# Patient Record
Sex: Male | Born: 1980 | Race: Black or African American | Hispanic: No | Marital: Single | Smoking: Former smoker
Health system: Southern US, Community
[De-identification: ages and names within clinical notes are randomized; demographics above are authoritative.]

## PROBLEM LIST (undated history)

## (undated) DIAGNOSIS — N200 Calculus of kidney: Secondary | ICD-10-CM

## (undated) DIAGNOSIS — Z87442 Personal history of urinary calculi: Secondary | ICD-10-CM

## (undated) HISTORY — PX: PARATHYROIDECTOMY: SHX19

## (undated) HISTORY — PX: HERNIA REPAIR: SHX51

---

## 2004-06-06 ENCOUNTER — Ambulatory Visit: Payer: Self-pay | Admitting: Urology

## 2004-06-17 ENCOUNTER — Ambulatory Visit: Payer: Self-pay | Admitting: Urology

## 2005-02-01 ENCOUNTER — Emergency Department: Payer: Self-pay | Admitting: Unknown Physician Specialty

## 2005-05-30 ENCOUNTER — Ambulatory Visit: Payer: Self-pay | Admitting: Urology

## 2005-06-16 ENCOUNTER — Emergency Department: Payer: Self-pay | Admitting: General Practice

## 2005-06-18 ENCOUNTER — Ambulatory Visit: Payer: Self-pay | Admitting: Urology

## 2005-06-21 ENCOUNTER — Observation Stay: Payer: Self-pay | Admitting: Urology

## 2005-07-07 ENCOUNTER — Ambulatory Visit: Payer: Self-pay | Admitting: Urology

## 2005-07-29 ENCOUNTER — Emergency Department: Payer: Self-pay | Admitting: Emergency Medicine

## 2005-10-10 ENCOUNTER — Ambulatory Visit: Payer: Self-pay | Admitting: Urology

## 2005-10-26 ENCOUNTER — Emergency Department: Payer: Self-pay | Admitting: Emergency Medicine

## 2006-03-15 ENCOUNTER — Emergency Department: Payer: Self-pay | Admitting: Emergency Medicine

## 2006-04-13 ENCOUNTER — Ambulatory Visit: Payer: Self-pay | Admitting: Urology

## 2006-08-10 ENCOUNTER — Ambulatory Visit: Payer: Self-pay | Admitting: Urology

## 2006-11-22 ENCOUNTER — Emergency Department: Payer: Self-pay | Admitting: Emergency Medicine

## 2006-12-05 ENCOUNTER — Emergency Department: Payer: Self-pay | Admitting: Emergency Medicine

## 2007-02-05 ENCOUNTER — Emergency Department: Payer: Self-pay | Admitting: Emergency Medicine

## 2007-02-12 ENCOUNTER — Emergency Department: Payer: Self-pay | Admitting: Emergency Medicine

## 2007-03-20 ENCOUNTER — Emergency Department: Payer: Self-pay | Admitting: Emergency Medicine

## 2007-04-16 ENCOUNTER — Emergency Department: Payer: Self-pay | Admitting: Emergency Medicine

## 2007-04-24 ENCOUNTER — Emergency Department: Payer: Self-pay | Admitting: Internal Medicine

## 2007-05-12 ENCOUNTER — Emergency Department: Payer: Self-pay | Admitting: Emergency Medicine

## 2007-06-10 ENCOUNTER — Ambulatory Visit: Payer: Self-pay | Admitting: Urology

## 2007-06-17 ENCOUNTER — Emergency Department: Payer: Self-pay | Admitting: Internal Medicine

## 2007-06-24 ENCOUNTER — Ambulatory Visit: Payer: Self-pay | Admitting: Urology

## 2007-07-12 ENCOUNTER — Emergency Department: Payer: Self-pay | Admitting: Internal Medicine

## 2007-07-15 ENCOUNTER — Ambulatory Visit: Payer: Self-pay | Admitting: Urology

## 2007-07-17 IMAGING — CR DG ABDOMEN 1V
1 series · 1 of 1 positions shown · non-contrast
Comparison: none

REASON FOR EXAM: Nephrolithiasis
COMMENTS:

[view not recorded]
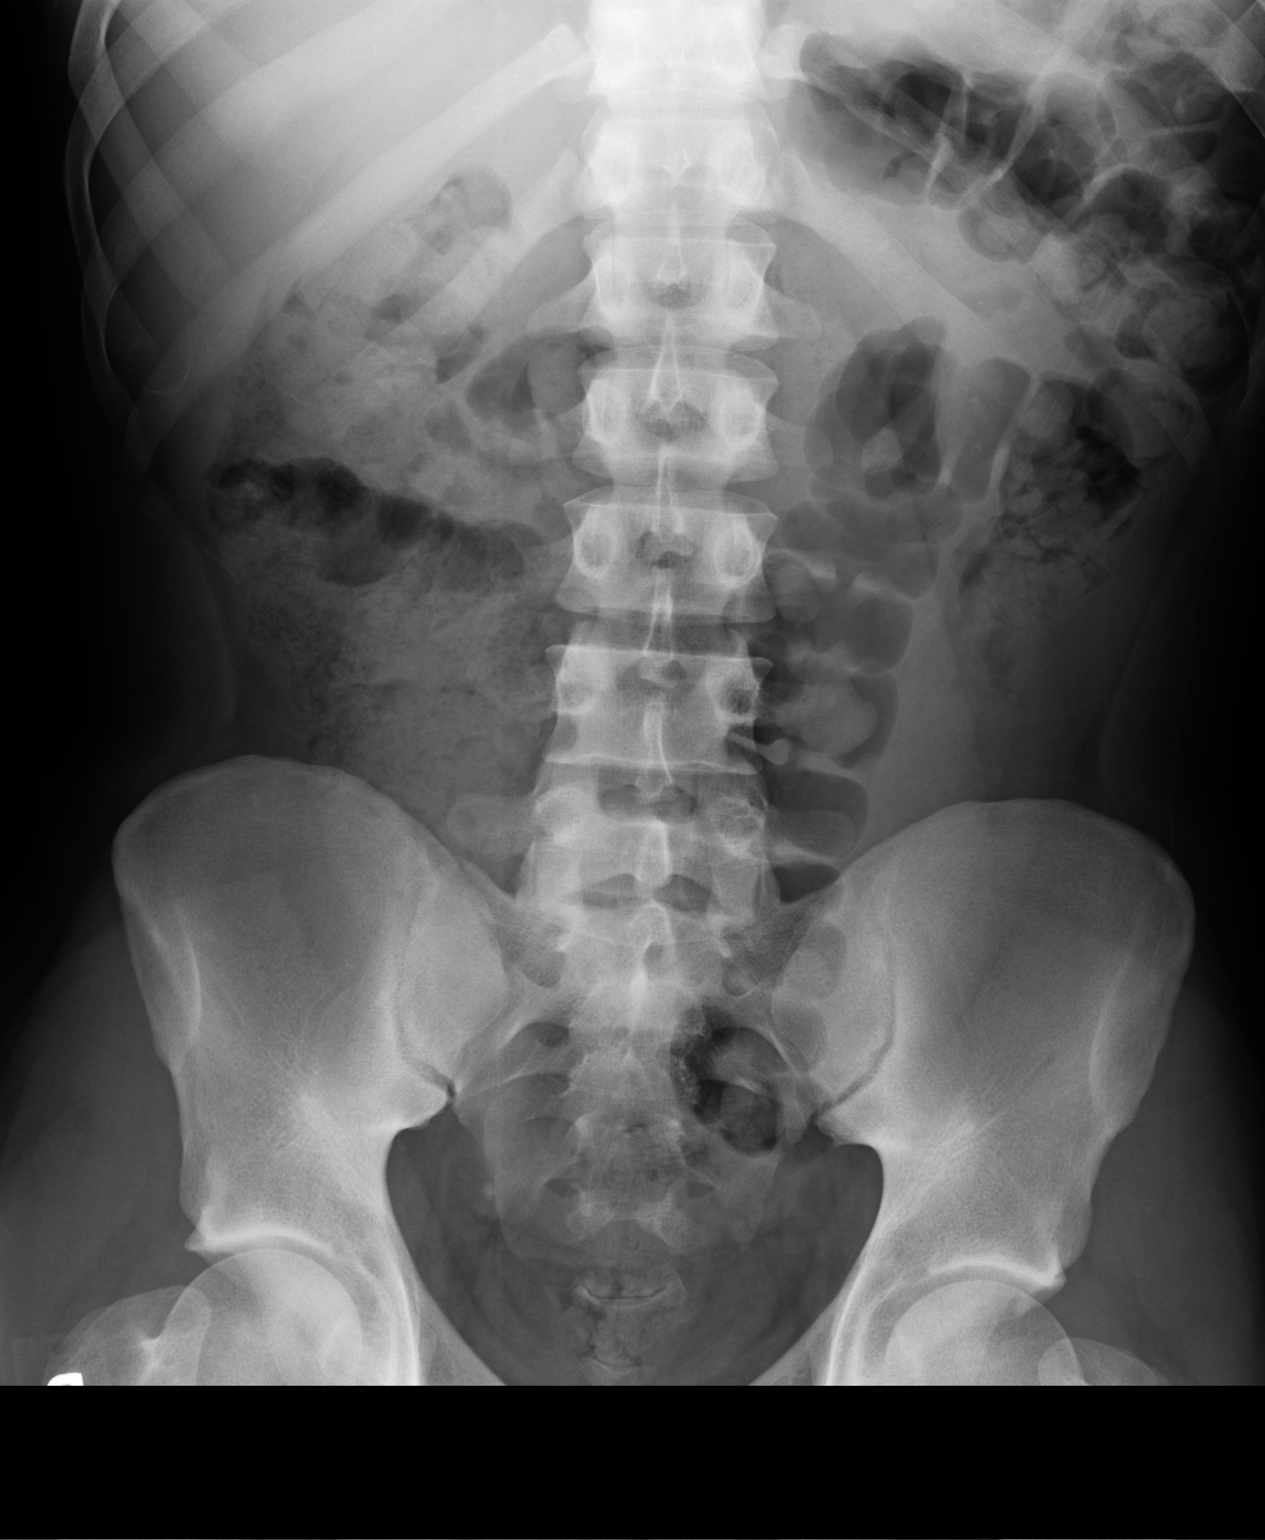

[1 of 1 positions shown; findings below may reference images not displayed]

PROCEDURE:     DXR - DXR KIDNEY URETER BLADDER  - May 30, 2005 [DATE]

RESULT:          A large amount of stool was appreciated within the region
of the ascending colon, and a moderate amount was seen in the region of the
descending colon.  Small, rounded, vaguely appreciated densities project in
the region of the LEFT kidney.  These appear to measure approximately 3-4 mm
in diameter, and they represent sequela of LEFT ureteral calculi.  No
further calcified densities are identified.  The visualized bony skeleton is
unremarkable.
IMPRESSION: 1.     Findings raising the suspicion of small calculi in the region of the
mid to upper pole area in the LEFT renal fossa region as described above.
2.     A large amount of stool in the region of the ascending colon with a
moderate amount in the region of the descending colon.

## 2007-07-22 ENCOUNTER — Ambulatory Visit: Payer: Self-pay | Admitting: Urology

## 2007-08-05 ENCOUNTER — Ambulatory Visit: Payer: Self-pay | Admitting: Urology

## 2007-08-13 ENCOUNTER — Emergency Department: Payer: Self-pay | Admitting: Emergency Medicine

## 2007-09-06 ENCOUNTER — Emergency Department: Payer: Self-pay | Admitting: Emergency Medicine

## 2007-09-09 ENCOUNTER — Ambulatory Visit: Payer: Self-pay | Admitting: Family Medicine

## 2007-09-28 ENCOUNTER — Emergency Department: Payer: Self-pay | Admitting: Unknown Physician Specialty

## 2007-09-30 ENCOUNTER — Ambulatory Visit: Payer: Self-pay

## 2007-10-02 ENCOUNTER — Emergency Department: Payer: Self-pay | Admitting: Emergency Medicine

## 2007-10-21 ENCOUNTER — Emergency Department: Payer: Self-pay | Admitting: Emergency Medicine

## 2007-10-24 ENCOUNTER — Emergency Department: Payer: Self-pay | Admitting: Internal Medicine

## 2007-11-12 ENCOUNTER — Emergency Department: Payer: Self-pay | Admitting: Emergency Medicine

## 2007-11-25 ENCOUNTER — Emergency Department: Payer: Self-pay | Admitting: Emergency Medicine

## 2007-12-16 ENCOUNTER — Emergency Department: Payer: Self-pay | Admitting: Emergency Medicine

## 2008-03-24 ENCOUNTER — Emergency Department: Payer: Self-pay | Admitting: Emergency Medicine

## 2008-04-07 ENCOUNTER — Emergency Department: Payer: Self-pay | Admitting: Emergency Medicine

## 2008-07-13 ENCOUNTER — Ambulatory Visit: Payer: Self-pay | Admitting: Internal Medicine

## 2008-07-19 ENCOUNTER — Emergency Department: Payer: Self-pay | Admitting: Internal Medicine

## 2008-10-20 ENCOUNTER — Emergency Department: Payer: Self-pay | Admitting: Emergency Medicine

## 2008-12-23 ENCOUNTER — Ambulatory Visit: Payer: Self-pay | Admitting: Internal Medicine

## 2008-12-29 ENCOUNTER — Emergency Department: Payer: Self-pay

## 2009-02-24 ENCOUNTER — Emergency Department: Payer: Self-pay

## 2009-03-22 ENCOUNTER — Ambulatory Visit: Payer: Self-pay | Admitting: Urology

## 2009-04-26 ENCOUNTER — Emergency Department: Payer: Self-pay | Admitting: Emergency Medicine

## 2009-05-24 ENCOUNTER — Ambulatory Visit: Payer: Self-pay | Admitting: Unknown Physician Specialty

## 2009-06-02 ENCOUNTER — Ambulatory Visit: Payer: Self-pay | Admitting: Internal Medicine

## 2009-06-19 ENCOUNTER — Ambulatory Visit: Payer: Self-pay | Admitting: Surgery

## 2009-07-14 ENCOUNTER — Ambulatory Visit: Payer: Self-pay | Admitting: Family Medicine

## 2009-08-10 IMAGING — CR DG ABDOMEN 1V
1 series · 1 of 1 positions shown · non-contrast
Comparison: none

REASON FOR EXAM: Renal calculi-lithotripsy
COMMENTS:

[view not recorded]
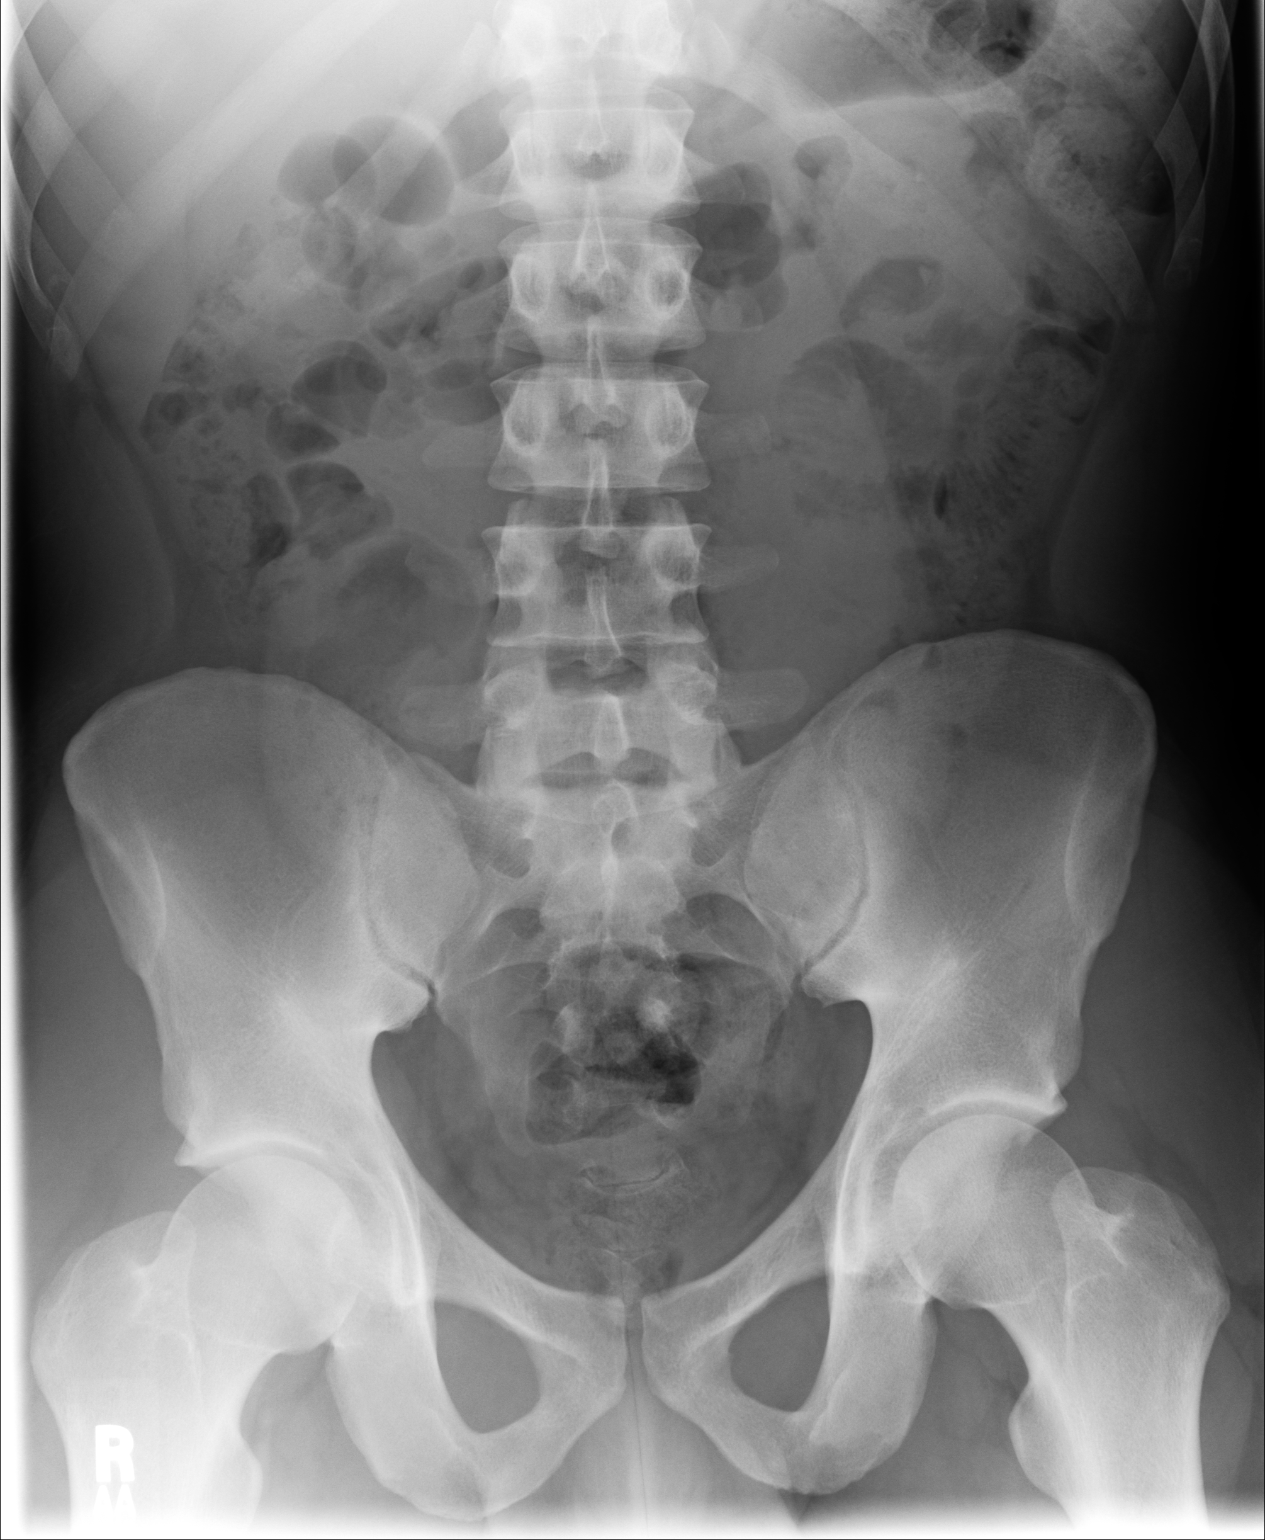

[1 of 1 positions shown; findings below may reference images not displayed]

PROCEDURE:     DXR - DXR KIDNEY URETER BLADDER  - June 24, 2007  [DATE]

RESULT:     Comparison is made to the exam dated 06/10/2007.

There appear to be some residual stone fragments in the mid LEFT kidney. No
definite ureteral calculi are evident. The RIGHT kidney is obscured. The
bony structures are unremarkable.
IMPRESSION: LEFT renal calculus.

## 2009-08-31 ENCOUNTER — Emergency Department: Payer: Self-pay | Admitting: Emergency Medicine

## 2009-11-23 ENCOUNTER — Emergency Department: Payer: Self-pay | Admitting: Emergency Medicine

## 2010-01-26 ENCOUNTER — Emergency Department: Payer: Self-pay | Admitting: Internal Medicine

## 2010-02-01 ENCOUNTER — Emergency Department: Payer: Self-pay | Admitting: Emergency Medicine

## 2010-03-05 ENCOUNTER — Emergency Department: Payer: Self-pay | Admitting: Unknown Physician Specialty

## 2010-03-23 ENCOUNTER — Emergency Department: Payer: Self-pay | Admitting: Emergency Medicine

## 2010-05-09 ENCOUNTER — Emergency Department: Payer: Self-pay | Admitting: Emergency Medicine

## 2010-07-26 ENCOUNTER — Emergency Department: Payer: Self-pay | Admitting: Emergency Medicine

## 2010-10-25 ENCOUNTER — Emergency Department: Payer: Self-pay | Admitting: Emergency Medicine

## 2010-12-11 ENCOUNTER — Ambulatory Visit: Payer: Self-pay | Admitting: Family Medicine

## 2011-01-05 ENCOUNTER — Emergency Department: Payer: Self-pay | Admitting: *Deleted

## 2011-01-24 ENCOUNTER — Emergency Department: Payer: Self-pay | Admitting: *Deleted

## 2011-03-01 ENCOUNTER — Emergency Department: Payer: Self-pay | Admitting: Unknown Physician Specialty

## 2011-03-01 ENCOUNTER — Ambulatory Visit: Payer: Self-pay

## 2011-04-06 ENCOUNTER — Emergency Department: Payer: Self-pay | Admitting: Emergency Medicine

## 2011-05-02 ENCOUNTER — Emergency Department: Payer: Self-pay | Admitting: Emergency Medicine

## 2011-07-21 ENCOUNTER — Ambulatory Visit: Payer: Self-pay | Admitting: Surgery

## 2011-07-21 LAB — BASIC METABOLIC PANEL
Anion Gap: 12 (ref 7–16)
Calcium, Total: 9.1 mg/dL (ref 8.5–10.1)
Chloride: 106 mmol/L (ref 98–107)
Co2: 26 mmol/L (ref 21–32)
Creatinine: 0.93 mg/dL (ref 0.60–1.30)
EGFR (African American): >60

## 2011-07-25 ENCOUNTER — Ambulatory Visit: Payer: Self-pay | Admitting: Surgery

## 2011-08-17 ENCOUNTER — Emergency Department: Payer: Self-pay | Admitting: *Deleted

## 2011-09-17 ENCOUNTER — Emergency Department: Payer: Self-pay | Admitting: Emergency Medicine

## 2011-09-27 ENCOUNTER — Emergency Department: Payer: Self-pay | Admitting: Emergency Medicine

## 2011-09-27 LAB — COMPREHENSIVE METABOLIC PANEL
Albumin: 4.3 g/dL (ref 3.4–5.0)
Alkaline Phosphatase: 121 U/L (ref 50–136)
Anion Gap: 11 (ref 7–16)
BUN: 13 mg/dL (ref 7–18)
Calcium, Total: 8.7 mg/dL (ref 8.5–10.1)
Chloride: 106 mmol/L (ref 98–107)
Co2: 22 mmol/L (ref 21–32)
Creatinine: 1.16 mg/dL (ref 0.60–1.30)
Potassium: 3.8 mmol/L (ref 3.5–5.1)

## 2011-09-27 LAB — URINALYSIS, COMPLETE
Glucose,UR: NEGATIVE mg/dL (ref 0–75)
Nitrite: NEGATIVE
Ph: 7 (ref 4.5–8.0)
Protein: NEGATIVE
RBC,UR: 7 /HPF (ref 0–5)
Specific Gravity: 1.023 (ref 1.003–1.030)
Squamous Epithelial: NONE SEEN
WBC UR: 2 /HPF (ref 0–5)

## 2011-09-27 LAB — CBC
HCT: 42.6 % (ref 40.0–52.0)
MCH: 28.1 pg (ref 26.0–34.0)
MCHC: 33.6 g/dL (ref 32.0–36.0)
Platelet: 247 10*3/uL (ref 150–440)
WBC: 9 10*3/uL (ref 3.8–10.6)

## 2011-11-21 ENCOUNTER — Emergency Department: Payer: Self-pay | Admitting: Emergency Medicine

## 2011-12-11 ENCOUNTER — Emergency Department: Payer: Self-pay | Admitting: Emergency Medicine

## 2012-02-07 ENCOUNTER — Emergency Department: Payer: Self-pay | Admitting: Internal Medicine

## 2012-02-07 LAB — URINALYSIS, COMPLETE
Bacteria: NONE SEEN
Glucose,UR: NEGATIVE mg/dL (ref 0–75)
Leukocyte Esterase: NEGATIVE
Nitrite: NEGATIVE
Ph: 6 (ref 4.5–8.0)
Protein: NEGATIVE
WBC UR: 1 /HPF (ref 0–5)

## 2012-02-07 LAB — CBC
HCT: 40.2 % (ref 40.0–52.0)
HGB: 13.2 g/dL (ref 13.0–18.0)
MCH: 27.2 pg (ref 26.0–34.0)
Platelet: 231 10*3/uL (ref 150–440)
RBC: 4.85 10*6/uL (ref 4.40–5.90)
RDW: 14 % (ref 11.5–14.5)
WBC: 4.5 10*3/uL (ref 3.8–10.6)

## 2012-02-07 LAB — BASIC METABOLIC PANEL
BUN: 11 mg/dL (ref 7–18)
Chloride: 107 mmol/L (ref 98–107)
Co2: 24 mmol/L (ref 21–32)
Creatinine: 0.92 mg/dL (ref 0.60–1.30)
EGFR (Non-African Amer.): >60
Sodium: 142 mmol/L (ref 136–145)

## 2012-03-12 ENCOUNTER — Emergency Department: Payer: Self-pay | Admitting: Emergency Medicine

## 2012-03-12 LAB — COMPREHENSIVE METABOLIC PANEL
Albumin: 4.4 g/dL (ref 3.4–5.0)
Alkaline Phosphatase: 121 U/L (ref 50–136)
BUN: 15 mg/dL (ref 7–18)
Calcium, Total: 9.2 mg/dL (ref 8.5–10.1)
EGFR (African American): >60
Glucose: 86 mg/dL (ref 65–99)
Potassium: 3.9 mmol/L (ref 3.5–5.1)
SGOT(AST): 33 U/L (ref 15–37)
SGPT (ALT): 19 U/L (ref 12–78)
Sodium: 140 mmol/L (ref 136–145)
Total Protein: 7.8 g/dL (ref 6.4–8.2)

## 2012-03-12 LAB — URINALYSIS, COMPLETE
Bilirubin,UR: NEGATIVE
Nitrite: NEGATIVE
Ph: 7 (ref 4.5–8.0)
Protein: NEGATIVE
RBC,UR: 3 /HPF (ref 0–5)
Specific Gravity: 1.013 (ref 1.003–1.030)
Squamous Epithelial: <1
WBC UR: <1 /HPF (ref 0–5)

## 2012-03-12 LAB — CBC
HCT: 41.1 % (ref 40.0–52.0)
HGB: 13.5 g/dL (ref 13.0–18.0)
MCHC: 33 g/dL (ref 32.0–36.0)
Platelet: 233 10*3/uL (ref 150–440)
RBC: 4.97 10*6/uL (ref 4.40–5.90)
RDW: 13.6 % (ref 11.5–14.5)
WBC: 8.6 10*3/uL (ref 3.8–10.6)

## 2012-03-17 ENCOUNTER — Ambulatory Visit: Payer: Self-pay | Admitting: Family Medicine

## 2012-04-19 ENCOUNTER — Emergency Department: Payer: Self-pay | Admitting: Emergency Medicine

## 2012-04-19 LAB — URINALYSIS, COMPLETE
Bacteria: NONE SEEN
Glucose,UR: NEGATIVE mg/dL (ref 0–75)
Leukocyte Esterase: NEGATIVE
Nitrite: NEGATIVE
Ph: 6 (ref 4.5–8.0)
Squamous Epithelial: <1
WBC UR: <1 /HPF (ref 0–5)

## 2012-04-19 LAB — CBC
MCH: 27.6 pg (ref 26.0–34.0)
MCHC: 33.3 g/dL (ref 32.0–36.0)
Platelet: 264 10*3/uL (ref 150–440)

## 2012-04-19 LAB — BASIC METABOLIC PANEL
Chloride: 105 mmol/L (ref 98–107)
Co2: 30 mmol/L (ref 21–32)
Creatinine: 0.99 mg/dL (ref 0.60–1.30)
Potassium: 4.2 mmol/L (ref 3.5–5.1)
Sodium: 139 mmol/L (ref 136–145)

## 2012-08-19 ENCOUNTER — Emergency Department: Payer: Self-pay | Admitting: Internal Medicine

## 2012-09-23 ENCOUNTER — Ambulatory Visit: Payer: Self-pay | Admitting: Family Medicine

## 2012-11-18 ENCOUNTER — Emergency Department: Payer: Self-pay | Admitting: Emergency Medicine

## 2012-11-18 LAB — COMPREHENSIVE METABOLIC PANEL
Albumin: 4 g/dL (ref 3.4–5.0)
Alkaline Phosphatase: 107 U/L (ref 50–136)
Anion Gap: 11 (ref 7–16)
BUN: 7 mg/dL (ref 7–18)
Bilirubin,Total: 0.4 mg/dL (ref 0.2–1.0)
Calcium, Total: 9.1 mg/dL (ref 8.5–10.1)
Co2: 24 mmol/L (ref 21–32)
Creatinine: 1.26 mg/dL (ref 0.60–1.30)
EGFR (African American): >60
EGFR (Non-African Amer.): >60
Glucose: 117 mg/dL — ABNORMAL HIGH (ref 65–99)
Potassium: 4 mmol/L (ref 3.5–5.1)
SGPT (ALT): 23 U/L (ref 12–78)
Sodium: 139 mmol/L (ref 136–145)
Total Protein: 7.3 g/dL (ref 6.4–8.2)

## 2012-11-18 LAB — CBC
HCT: 42.2 % (ref 40.0–52.0)
MCV: 80 fL (ref 80–100)
Platelet: 187 10*3/uL (ref 150–440)
RBC: 5.31 10*6/uL (ref 4.40–5.90)
RDW: 14 % (ref 11.5–14.5)
WBC: 4.9 10*3/uL (ref 3.8–10.6)

## 2012-11-18 LAB — CSF CELL COUNT WITH DIFFERENTIAL: CSF Tube #: 3

## 2012-11-18 LAB — URINALYSIS, COMPLETE
Bacteria: NONE SEEN
Glucose,UR: NEGATIVE mg/dL (ref 0–75)
Ketone: NEGATIVE
Specific Gravity: 1.02 (ref 1.003–1.030)
Squamous Epithelial: NONE SEEN

## 2012-11-18 LAB — CSF CELL CT + PROT + GLU PANEL: Protein, CSF: 39 mg/dL (ref 15–45)

## 2012-11-23 LAB — CULTURE, BLOOD (SINGLE)

## 2013-01-27 ENCOUNTER — Emergency Department: Payer: Self-pay | Admitting: Internal Medicine

## 2013-06-08 ENCOUNTER — Emergency Department: Payer: Self-pay | Admitting: Emergency Medicine

## 2013-06-08 LAB — URINALYSIS, COMPLETE
Bacteria: NONE SEEN
Bilirubin,UR: NEGATIVE
Blood: NEGATIVE
GLUCOSE, UR: NEGATIVE mg/dL (ref 0–75)
Ketone: NEGATIVE
Leukocyte Esterase: NEGATIVE
Nitrite: NEGATIVE
PH: 6 (ref 4.5–8.0)
PROTEIN: NEGATIVE
RBC,UR: 1 /HPF (ref 0–5)
SPECIFIC GRAVITY: 1.024 (ref 1.003–1.030)
Squamous Epithelial: NONE SEEN
WBC UR: 2 /HPF (ref 0–5)

## 2013-06-27 ENCOUNTER — Ambulatory Visit: Payer: Self-pay | Admitting: Surgery

## 2013-06-27 LAB — CBC WITH DIFFERENTIAL/PLATELET
BASOS ABS: 0 10*3/uL (ref 0.0–0.1)
Basophil %: 0.6 %
EOS PCT: 0.4 %
Eosinophil #: 0 10*3/uL (ref 0.0–0.7)
HCT: 45.6 % (ref 40.0–52.0)
HGB: 14.7 g/dL (ref 13.0–18.0)
LYMPHS ABS: 2.3 10*3/uL (ref 1.0–3.6)
LYMPHS PCT: 29.1 %
MCH: 26.5 pg (ref 26.0–34.0)
MCHC: 32.1 g/dL (ref 32.0–36.0)
MCV: 83 fL (ref 80–100)
MONOS PCT: 7.8 %
Monocyte #: 0.6 x10 3/mm (ref 0.2–1.0)
Neutrophil #: 4.9 10*3/uL (ref 1.4–6.5)
Neutrophil %: 62.1 %
PLATELETS: 229 10*3/uL (ref 150–440)
RBC: 5.52 10*6/uL (ref 4.40–5.90)
RDW: 13.5 % (ref 11.5–14.5)
WBC: 7.9 10*3/uL (ref 3.8–10.6)

## 2013-06-27 LAB — BASIC METABOLIC PANEL
Anion Gap: 3 — ABNORMAL LOW (ref 7–16)
BUN: 14 mg/dL (ref 7–18)
CHLORIDE: 106 mmol/L (ref 98–107)
Calcium, Total: 9.9 mg/dL (ref 8.5–10.1)
Co2: 29 mmol/L (ref 21–32)
Creatinine: 1.08 mg/dL (ref 0.60–1.30)
EGFR (African American): >60
EGFR (Non-African Amer.): >60
Glucose: 96 mg/dL (ref 65–99)
OSMOLALITY: 276 (ref 275–301)
Potassium: 3.9 mmol/L (ref 3.5–5.1)
Sodium: 138 mmol/L (ref 136–145)

## 2013-06-29 ENCOUNTER — Ambulatory Visit: Payer: Self-pay | Admitting: Surgery

## 2013-08-01 ENCOUNTER — Emergency Department: Payer: Self-pay | Admitting: Emergency Medicine

## 2013-08-01 LAB — COMPREHENSIVE METABOLIC PANEL
ALT: 19 U/L (ref 12–78)
Albumin: 4.8 g/dL (ref 3.4–5.0)
Alkaline Phosphatase: 100 U/L
Anion Gap: 6 — ABNORMAL LOW (ref 7–16)
BILIRUBIN TOTAL: 0.6 mg/dL (ref 0.2–1.0)
BUN: 13 mg/dL (ref 7–18)
Calcium, Total: 9.7 mg/dL (ref 8.5–10.1)
Chloride: 106 mmol/L (ref 98–107)
Co2: 27 mmol/L (ref 21–32)
Creatinine: 1.03 mg/dL (ref 0.60–1.30)
EGFR (African American): >60
EGFR (Non-African Amer.): >60
GLUCOSE: 92 mg/dL (ref 65–99)
OSMOLALITY: 277 (ref 275–301)
Potassium: 3.8 mmol/L (ref 3.5–5.1)
SGOT(AST): 17 U/L (ref 15–37)
Sodium: 139 mmol/L (ref 136–145)
TOTAL PROTEIN: 8.3 g/dL — AB (ref 6.4–8.2)

## 2013-08-01 LAB — CBC WITH DIFFERENTIAL/PLATELET
BASOS PCT: 0.7 %
Basophil #: 0.1 10*3/uL (ref 0.0–0.1)
Eosinophil #: 0 10*3/uL (ref 0.0–0.7)
Eosinophil %: 0.4 %
HCT: 43.7 % (ref 40.0–52.0)
HGB: 13.9 g/dL (ref 13.0–18.0)
LYMPHS ABS: 2.8 10*3/uL (ref 1.0–3.6)
Lymphocyte %: 32.3 %
MCH: 26.1 pg (ref 26.0–34.0)
MCHC: 31.9 g/dL — ABNORMAL LOW (ref 32.0–36.0)
MCV: 82 fL (ref 80–100)
MONO ABS: 0.7 x10 3/mm (ref 0.2–1.0)
Monocyte %: 7.7 %
NEUTROS PCT: 58.9 %
Neutrophil #: 5.1 10*3/uL (ref 1.4–6.5)
Platelet: 266 10*3/uL (ref 150–440)
RBC: 5.34 10*6/uL (ref 4.40–5.90)
RDW: 13.8 % (ref 11.5–14.5)
WBC: 8.6 10*3/uL (ref 3.8–10.6)

## 2013-08-01 LAB — URINALYSIS, COMPLETE
BACTERIA: NONE SEEN
Bilirubin,UR: NEGATIVE
Glucose,UR: NEGATIVE mg/dL (ref 0–75)
Ketone: NEGATIVE
Leukocyte Esterase: NEGATIVE
Nitrite: NEGATIVE
PH: 7 (ref 4.5–8.0)
PROTEIN: NEGATIVE
SPECIFIC GRAVITY: 1.025 (ref 1.003–1.030)
Squamous Epithelial: 1

## 2013-09-03 ENCOUNTER — Ambulatory Visit: Payer: Self-pay | Admitting: Family Medicine

## 2013-10-31 ENCOUNTER — Ambulatory Visit: Payer: Self-pay | Admitting: Physician Assistant

## 2013-12-31 ENCOUNTER — Ambulatory Visit: Payer: Self-pay | Admitting: Family Medicine

## 2014-03-08 ENCOUNTER — Ambulatory Visit: Payer: Self-pay | Admitting: Family Medicine

## 2014-03-08 LAB — RAPID STREP-A WITH REFLX: Micro Text Report: NEGATIVE

## 2014-03-08 LAB — RAPID INFLUENZA A&B ANTIGENS

## 2014-03-11 LAB — BETA STREP CULTURE(ARMC)

## 2014-03-28 ENCOUNTER — Ambulatory Visit: Payer: Self-pay | Admitting: Physician Assistant

## 2014-03-30 ENCOUNTER — Ambulatory Visit: Payer: Self-pay

## 2014-04-11 ENCOUNTER — Ambulatory Visit: Payer: Self-pay | Admitting: Family Medicine

## 2014-05-23 ENCOUNTER — Ambulatory Visit: Payer: Self-pay | Admitting: Physician Assistant

## 2014-06-05 ENCOUNTER — Emergency Department: Payer: Self-pay | Admitting: Emergency Medicine

## 2014-08-18 ENCOUNTER — Emergency Department
Admit: 2014-08-18 | Disposition: A | Discharge status: Home / Self Care | Payer: Self-pay | Admitting: Emergency Medicine

## 2014-08-18 LAB — LIPASE, BLOOD: LIPASE: 51 U/L

## 2014-08-18 LAB — URINALYSIS, COMPLETE
BACTERIA: NONE SEEN
BILIRUBIN, UR: NEGATIVE
Blood: NEGATIVE
GLUCOSE, UR: NEGATIVE mg/dL (ref 0–75)
Ketone: NEGATIVE
Leukocyte Esterase: NEGATIVE
Nitrite: NEGATIVE
Ph: 9 (ref 4.5–8.0)
Protein: NEGATIVE
RBC,UR: 1 /HPF (ref 0–5)
Specific Gravity: 1.012 (ref 1.003–1.030)
WBC UR: 2 /HPF (ref 0–5)

## 2014-08-18 LAB — COMPREHENSIVE METABOLIC PANEL
ALBUMIN: 5 g/dL
AST: 23 U/L
Alkaline Phosphatase: 88 U/L
Anion Gap: 9 (ref 7–16)
BUN: 14 mg/dL
Bilirubin,Total: 0.7 mg/dL
CHLORIDE: 106 mmol/L
Calcium, Total: 10.2 mg/dL
Co2: 24 mmol/L
Creatinine: 1.03 mg/dL
EGFR (Non-African Amer.): >60
Glucose: 129 mg/dL — ABNORMAL HIGH
POTASSIUM: 3.9 mmol/L
SGPT (ALT): 21 U/L
Sodium: 139 mmol/L
TOTAL PROTEIN: 8.3 g/dL — AB

## 2014-08-18 LAB — CBC
HCT: 45.2 % (ref 40.0–52.0)
HGB: 14.9 g/dL (ref 13.0–18.0)
MCH: 26.6 pg (ref 26.0–34.0)
MCHC: 32.9 g/dL (ref 32.0–36.0)
MCV: 81 fL (ref 80–100)
PLATELETS: 223 10*3/uL (ref 150–440)
RBC: 5.6 10*6/uL (ref 4.40–5.90)
RDW: 14 % (ref 11.5–14.5)
WBC: 8 10*3/uL (ref 3.8–10.6)

## 2014-09-09 NOTE — Op Note (Signed)
PATIENT NAME:  Terry SprinklesSWANN, Weylin A MR#:  [id number] DATE OF BIRTH:  June 16, 1980  DATE OF PROCEDURE:  06/29/2013  PREOPERATIVE DIAGNOSIS: Recurrent right inguinal hernia.   POSTOPERATIVE DIAGNOSIS; Recurrent right inguinal hernia.   PROCEDURE: Laparoscopic preperitoneal repair of a recurrent right inguinal hernia.   SURGEON: Dionne Miloichard Brien Lowe, MD.  ASSISTANNicholes Rough: Applebaum, PA-S.   INDICATIONS: This is a patient with a right inguinal hernia requiring repair. Preoperatively, we discussed the rationale for surgery, the options of observation, risk of bleeding, infection, recurrence, ischemic orchitis, chronic pain syndrome and neuroma. This was all reviewed for him in the preop holding area. He understood and agreed to proceed.   FINDINGS: Scar in the area of the internal ring and small indirect sac with strong floor.   DESCRIPTION OF PROCEDURE: The patient was induced to general anesthesia, given IV antibiotics. A Foley catheter was placed. He was then prepped and draped in a sterile fashion. Marcaine was infiltrated in skin and subcutaneous tissues into the periumbilical area. An incision was made with dissection down the right rectus fascia was performed. It was incised and the muscle retracted laterally. The US surgical dissecting balloon was placed followed by the US surgical structural balloon, and the preperitoneal space was insufflated and under direct vision two midline 5 mm ports were placed. Lateesha Bezold's ligament was delineated. The lateral extent of dissection was determined and the nerve was identified and kept in view at all times. The cord was skeletonized of a indirect sac, which was retracted cephalad. There was not much of a cord lipoma. The internal ring appeared scarified.   Into the preperitoneal space was placed a Bard soft mesh, 4 x 6 laterally scissored and was held in place with the US surgical tacker, avoiding the nerve area. The preperitoneal space was then desufflated under direct vision.  There was no sign of peritoneal rent. The fascial edges were approximated with 0 Vicryl and 4-0 subcuticular Monocryl was used on all skin edges. Steri-Strips, Mastisol and sterile dressings were placed. The patient tolerated the procedure well. There were no complications. He was taken to the recovery room in stable condition to be discharged to the care of his family with follow up in 10 days.    ____________________________ Adah Salvageichard E. Excell Seltzerooper, MD rec:aw D: 06/29/2013 08:17:10 ET T: 06/29/2013 08:22:35 ET JOB#: [id number]  cc: Adah Salvageichard E. Excell Seltzerooper, MD, <Dictator> Lattie HawICHARD E Janeshia Ciliberto MD ELECTRONICALLY SIGNED 06/29/2013 16:48

## 2014-09-09 NOTE — H&P (Signed)
PATIENT NAME:  Terry SprinklesSWANN, Ibrahem A MR#:  [id number] DATE OF BIRTH:  [birth year]  DATE OF OPERATION:  06/29/2013  CHIEF COMPLAINT: Right groin pain.   HISTORY OF PRESENT ILLNESS: This is a 34 year old male with a recurrent right inguinal hernia. He had a right inguinal hernia repair as a child and pain started two weeks prior to being seen in the office and is worsening. He has noticed a small bulge is well.   PAST MEDICAL HISTORY: Pilonidal cyst, kidney stones, parathyroidectomy.   MEDICATIONS: Multiple, see chart.   ALLERGIES: BENADRYL AND DOXYCYCLINE.   SOCIAL HISTORY: Nonsmoker, nondrinker.   REVIEW OF SYSTEMS: A 10-system review has been performed and negative with the exception of that mentioned in the HPI, and it is documented in the chart in the office.   PHYSICAL EXAMINATION:  GENERAL: Healthy male patient.  HEENT: No scleral icterus.  NECK: No palpable neck nodes.  CHEST: Clear to auscultation.  CARDIAC: Regular rate and rhythm.  ABDOMEN: Soft and nontender.  GENITOURINARY: A recurrent right inguinal hernia which is small.   ASSESSMENT AND PLAN: Recurrent right renal hernia. Recommend laparoscopic preperitoneal repair. The rationale for this has been discussed. The option of observation have been reviewed and the risks of bleeding, infection, recurrence, ischemic orchitis, chronic pain syndrome and neuroma have all been discussed. He understood and agreed to proceed.  ____________________________ Adah Salvageichard E. Excell Seltzerooper, MD rec:np D: 06/28/2013 14:47:59 ET T: 06/28/2013 15:26:36 ET JOB#: [id number]  cc: Adah Salvageichard E. Excell Seltzerooper, MD, <Dictator> Lattie HawICHARD E Yanelle Sousa MD ELECTRONICALLY SIGNED 06/28/2013 17:27

## 2014-09-10 NOTE — Op Note (Signed)
PATIENT NAME:  Terry Shaffer, Terry Shaffer MR#:  [id number] DATE OF BIRTH:  May 19, 1981  DATE OF PROCEDURE:  07/25/2011  Lamar SprinklesREOPERATIVE DIAGNOSIS: Chronic pilonidal cyst.   POSTOPERATIVE DIAGNOSIS: Chronic pilonidal cyst.   PROCEDURE PERFORMED: Pilonidal cystectomy.   SURGEON: Raynald KempMark Shaffer Shaia Porath, MD    ANESTHESIA: General endotracheal and local 30 mL of 0.25% Marcaine with epinephrine.   SPECIMEN: Pilonidal cyst.   ESTIMATED BLOOD LOSS: Minimal.   DESCRIPTION OF PROCEDURE: With the patient in the prone jackknife position, the area around the intragluteal cleft was clipped of hair, prepped and draped with ChloraPrep solution. An elliptical incision encompassing the sinus tracts and previous I and D site was performed with scalpel. Dissection was carried down to the sacral fascia. Specimen was handed off the field in formalin.   With hemostasis being obtained with point cautery, the field block was created with Shaffer total of 30 mL of local anesthesia as described above.   Complex closure was then obtained utilizing interrupted 0 Vicryl sutures encompassing small bites of the fascia to obliterate the dead space. Interrupted inverted 2-0 Vicryls were then placed in deep dermal fashion. Interrupted simple and vertical mattress 3-0 nylon sutures were placed in the skin layer. Shaffer sterile occlusive dressing was placed.   The patient was then subsequently returned supine, extubated, and taken to the recovery room in stable and satisfactory condition by anesthesia services.   ____________________________ Redge GainerMark Shaffer. Egbert GaribaldiBird, MD mab:drc D: 07/25/2011 08:37:12 ET T: 07/25/2011 09:14:31 ET JOB#: [id number]  cc: Loraine LericheMark Shaffer. Egbert GaribaldiBird, MD, <Dictator> Raynald KempMARK Shaffer Reese Stockman MD ELECTRONICALLY SIGNED 07/26/2011 17:41

## 2014-10-12 ENCOUNTER — Emergency Department
Admission: EM | Admit: 2014-10-12 | Discharge: 2014-10-12 | Disposition: A | Discharge status: Home / Self Care | Payer: BLUE CROSS/BLUE SHIELD | Attending: Emergency Medicine | Admitting: Emergency Medicine

## 2014-10-12 ENCOUNTER — Emergency Department: Payer: BLUE CROSS/BLUE SHIELD

## 2014-10-12 DIAGNOSIS — N99820 Postprocedural hemorrhage and hematoma of a genitourinary system organ or structure following a genitourinary system procedure: Secondary | ICD-10-CM | POA: Insufficient documentation

## 2014-10-12 DIAGNOSIS — Y836 Removal of other organ (partial) (total) as the cause of abnormal reaction of the patient, or of later complication, without mention of misadventure at the time of the procedure: Secondary | ICD-10-CM | POA: Diagnosis not present

## 2014-10-12 DIAGNOSIS — N508 Other specified disorders of male genital organs: Secondary | ICD-10-CM | POA: Diagnosis present

## 2014-10-12 DIAGNOSIS — N99840 Postprocedural hematoma of a genitourinary system organ or structure following a genitourinary system procedure: Secondary | ICD-10-CM

## 2014-10-12 DIAGNOSIS — R52 Pain, unspecified: Secondary | ICD-10-CM

## 2014-10-12 HISTORY — PX: VASECTOMY: SHX75

## 2014-10-12 MED ORDER — HYDROMORPHONE HCL 1 MG/ML IJ SOLN
INTRAMUSCULAR | Status: AC
  Administered 2014-10-12: 1 mg
  Filled 2014-10-12: qty 1

## 2014-10-12 MED ORDER — HYDROMORPHONE HCL 1 MG/ML IJ SOLN
1.0000 mg | Freq: Once | INTRAMUSCULAR | Status: AC
  Administered 2014-10-12: 1 mg via INTRAVENOUS

## 2014-10-12 MED ORDER — CEPHALEXIN 500 MG PO CAPS: 500.0000 mg | ORAL_CAPSULE | Freq: Three times a day (TID) | ORAL | Status: AC

## 2014-10-12 MED ORDER — HYDROMORPHONE HCL 1 MG/ML IJ SOLN
INTRAMUSCULAR | Status: AC
  Administered 2014-10-12: 1 mg via INTRAVENOUS
  Filled 2014-10-12: qty 1

## 2014-10-12 MED ORDER — MORPHINE SULFATE 2 MG/ML IJ SOLN
INTRAMUSCULAR | Status: AC
  Filled 2014-10-12: qty 3

## 2014-10-12 MED ORDER — HYDROCODONE-ACETAMINOPHEN 5-325 MG PO TABS: 1.0000 | ORAL_TABLET | ORAL | Status: DC | PRN

## 2014-10-12 MED ORDER — MORPHINE SULFATE 4 MG/ML IJ SOLN
6.0000 mg | Freq: Once | INTRAMUSCULAR | Status: AC
  Administered 2014-10-12: 6 mg via INTRAMUSCULAR

## 2014-10-12 MED ORDER — HYDROMORPHONE HCL 1 MG/ML PO LIQD: 1.0000 mg | Freq: Four times a day (QID) | ORAL | Status: DC | PRN

## 2014-10-12 NOTE — ED Provider Notes (Signed)
[facility] Emergency Department Provider Note    ____________________________________________  Time seen: 1616  I have reviewed the triage vital signs and the nursing notes.   HISTORY  Chief Complaint Groin Pain   History limited by: Not Limited   HPI Edilson Vital is a 34 y.o. male resents to the emergency department because of right testicular pain and swelling. The patient underwent a vasectomy earlier today. He states that once the numbing medication wore off he started experiencing severe pain. They contacted his urologist who prescribed oral pain medication which patient states did not fully alleviate the pain.Patient denies any fevers.     No past medical history on file.  There are no active problems to display for this patient.   No past surgical history on file.  No current outpatient prescriptions on file.  Allergies Diphenhydramine and Doxycycline  No family history on file.  Social History History  Substance Use Topics  . Smoking status: Not on file  . Smokeless tobacco: Not on file  . Alcohol Use: Not on file    Review of Systems  Constitutional: Negative for fever. Cardiovascular: Negative for chest pain. Respiratory: Negative for shortness of breath. Gastrointestinal: Negative for abdominal pain, vomiting and diarrhea. Genitourinary: Negative for dysuria. Musculoskeletal: Negative for back pain. Skin: Negative for rash. Neurological: Negative for headaches, focal weakness or numbness.   10-point ROS otherwise negative.  ____________________________________________   PHYSICAL EXAM:  VITAL SIGNS: ED Triage Vitals  Enc Vitals Group     BP 10/12/14 1603 138/86 mmHg     Pulse Rate 10/12/14 1603 94     Resp 10/12/14 1603 22     Temp 10/12/14 1603 97.5 F (36.4 C)     Temp Source 10/12/14 1603 Oral     SpO2 10/12/14 1603 99 %     Weight 10/12/14 1603 206 lb (93.441 kg)     Height 10/12/14 1603   (1.803 m)     Head Cir --      Peak Flow --      Pain Score 10/12/14 1603 10   Constitutional: Alert and oriented. Well appearing and in no distress. Eyes: Conjunctivae are normal. PERRL. Normal extraocular movements. ENT   Head: Normocephalic and atraumatic.   Nose: No congestion/rhinnorhea.   Mouth/Throat: Mucous membranes are moist.   Neck: No stridor. Hematological/Lymphatic/Immunilogical: No cervical lymphadenopathy. Cardiovascular: Normal rate  Respiratory: Normal respiratory effort without tachypnea nor retractions. Genitourinary: Incision clean, dry, intact. Swelling to right testicle and scrotum initiated. Tender to palpation. Musculoskeletal: Normal range of motion in all extremities. No joint effusions.  No lower extremity tenderness nor edema. Neurologic:  Normal speech and language. No gross focal neurologic deficits are appreciated. Speech is normal.  Skin:  Skin is warm, dry and intact. No rash noted. Psychiatric: Mood and affect are normal. Speech and behavior are normal. Patient exhibits appropriate insight and judgment.  ____________________________________________    LABS (pertinent positives/negatives)  None  ____________________________________________   EKG  None  ____________________________________________    RADIOLOGY  Ultrasound scrotum IMPRESSION: 1. Findings described above most compatible with a large right scrotal postoperative hematoma displacing the right testicle superiorly. 2. Diffusely enlarged and heterogeneous right epididymis and mild heterogeneity of the right testicle. This could be due to epididymo-orchitis or posttraumatic changes related to the recent surgery. 3. Mildly diffusely enlarged and heterogeneous left epididymis. This could be due to epididymitis or posttraumatic changes due to the recent surgery. 4. Marked diffuse scrotal skin  edema.  ____________________________________________   PROCEDURES  Procedure(s) performed: None  Critical Care performed: No  ____________________________________________   INITIAL IMPRESSION / ASSESSMENT AND PLAN / ED COURSE  Pertinent labs & imaging results that were available during my care of the patient were reviewed by me and considered in my medical decision making (see chart for details).  Patient with right scrotal pain and swelling and tenderness after a vasectomy today. Did talk to Dr. Apolinar JunesBrandon with urology states that the surgery went routinely without any complicating events from her excessive bleeding.   ----------------------------------------- 7:11 PM on 10/12/2014 -----------------------------------------  Patient's ultrasound back and shows a large hematoma. Likely this explains some of the discomfort and swelling. Also question epididymitis however given timing I think more likely represents post surgical changes.  Dr. Sherron MondayMacDiarmid with urology saw patient. Feels safe for discharge with recommendation for keflex. Patient will see Dr. Apolinar JunesBrandon tomorrow in clinic. ____________________________________________   FINAL CLINICAL IMPRESSION(S) / ED DIAGNOSES  Final diagnoses:  Pain  Postoperative hematoma involving genitourinary system following genitourinary procedure     Phineas SemenGraydon Lexxi Koslow, MD 10/12/14 2234

## 2014-10-12 NOTE — Consult Note (Signed)
Urology Consult  Referring physician: Virginia CrewsG Goodman Reason for referral: Scrotal hematoma  Chief Complaint: Scrotal Hematoma  History of Present Illness: Vasectomy today; called for hematoma getting worse; Pt had spoke with Dr Apolinar JunesBrandon and asked for pain meds; pain on right side; pain med history discussed with Dr Apolinar JunesBrandon; ultrasound demonstrates normal blood flow to Rt testes displace by hematoma;heterogenous area noted Rt scrotum; large Rt epididymis;  Has voided; no fever; previous Rt orchiopexy with some difficulty with vasectomy on that side? Modifying factors: There are no other modifying factors  Associated signs and symptoms: There are no other associated signs and symptoms Aggravating and relieving factors: There are no other aggravating or relieving factors Severity: Moderate Duration: Persistent No past medical history on file. No past surgical history on file.  Medications: I have reviewed the patient's current medications. Allergies:  Allergies  Allergen Reactions  . Diphenhydramine Swelling  . Doxycycline Anxiety    No family history on file. Social History:  has no tobacco, alcohol, and drug history on file.  ROS: All systems are reviewed and negative except as noted. Rest negative  Physical Exam:  Vital signs in last 24 hours: Temp:  [97.5 F (36.4 C)] 97.5 F (36.4 C) (05/26 1603) Pulse Rate:  [94] 94 (05/26 1603) Resp:  [22] 22 (05/26 1603) BP: (138)/(86) 138/86 mmHg (05/26 1603) SpO2:  [99 %] 99 % (05/26 1603) Weight:  [93.441 kg (206 lb)] 93.441 kg (206 lb) (05/26 1603)  Cardiovascular: Skin warm; not flushed Respiratory: Breaths quiet; no shortness of breath Abdomen: No masses Neurological: Normal sensation to touch Musculoskeletal: Normal motor function arms and legs Lymphatics: No inguinal adenopathy Skin: No rashes Genitourinary:Rt scrotal hematoma 1/2-2/3 size of softball; rt testes bit hard to feel but present; left testes OK; incision OK; penile  edema soft and no cellulitis   Laboratory Data:  No results found for this or any previous visit (from the past 72 hour(s)). No results found for this or any previous visit (from the past 240 hour(s)). Creatinine: No results for input(s): CREATININE in the last 168 hours.  Xrays: See report/chart See above  Impression/Assessment:  Rt scrotal hematoma; reviewed in detail with pt and wife; do not recommend scrotal exploration but close observation; went over closed box issue and penile swelling in detail; rare risk of testes ischemia and infection  Plan:  Vicoden/keflex/ice/see tomorrow is clinic  Severo Beber A 10/12/2014, 8:19 PM

## 2014-10-12 NOTE — ED Notes (Signed)
Dr Derrill KayGoodman to triage to evaluate. Verbal order for IM morphine

## 2014-10-12 NOTE — ED Notes (Signed)
[facility] C/o right sided groin pain. Patient appears in distress at this time.

## 2014-10-12 NOTE — ED Notes (Signed)
Patient reports vasectomy today at 10:30, now with severe pain to right testicle. Patient reports this started after numbing medicine wore off and that they had trouble on that side.

## 2014-10-12 NOTE — Discharge Instructions (Signed)
Please seek medical attention for any high fevers, chest pain, shortness of breath, change in behavior, persistent vomiting, bloody stool or any other new or concerning symptoms. ° °Hematoma °A hematoma is a collection of blood under the skin, in an organ, in a body space, in a joint space, or in other tissue. The blood can clot to form a lump that you can see and feel. The lump is often firm and may sometimes become sore and tender. Most hematomas get better in a few days to weeks. However, some hematomas may be serious and require medical care. Hematomas can range in size from very small to very large. °CAUSES  °A hematoma can be caused by a blunt or penetrating injury. It can also be caused by spontaneous leakage from a blood vessel under the skin. Spontaneous leakage from a blood vessel is more likely to occur in older people, especially those taking blood thinners. Sometimes, a hematoma can develop after certain medical procedures. °SIGNS AND SYMPTOMS  °· A firm lump on the body. °· Possible pain and tenderness in the area. °· Bruising. Blue, dark blue, purple-red, or yellowish skin may appear at the site of the hematoma if the hematoma is close to the surface of the skin. °For hematomas in deeper tissues or body spaces, the signs and symptoms may be subtle. For example, an intra-abdominal hematoma may cause abdominal pain, weakness, fainting, and shortness of breath. An intracranial hematoma may cause a headache or symptoms such as weakness, trouble speaking, or a change in consciousness. °DIAGNOSIS  °A hematoma can usually be diagnosed based on your medical history and a physical exam. Imaging tests may be needed if your health care provider suspects a hematoma in deeper tissues or body spaces, such as the abdomen, head, or chest. These tests may include ultrasonography or a CT scan.  °TREATMENT  °Hematomas usually go away on their own over time. Rarely does the blood need to be drained out of the body. Large  hematomas or those that may affect vital organs will sometimes need surgical drainage or monitoring. °HOME CARE INSTRUCTIONS  °· Apply ice to the injured area:   °¨ Put ice in a plastic bag.   °¨ Place a towel between your skin and the bag.   °¨ Leave the ice on for 20 minutes, 2-3 times a day for the first 1 to 2 days.   °· After the first 2 days, switch to using warm compresses on the hematoma.   °· Elevate the injured area to help decrease pain and swelling. Wrapping the area with an elastic bandage may also be helpful. Compression helps to reduce swelling and promotes shrinking of the hematoma. Make sure the bandage is not wrapped too tight.   °· If your hematoma is on a lower extremity and is painful, crutches may be helpful for a couple days.   °· Only take over-the-counter or prescription medicines as directed by your health care provider. °SEEK IMMEDIATE MEDICAL CARE IF:  °· You have increasing pain, or your pain is not controlled with medicine.   °· You have a fever.   °· You have worsening swelling or discoloration.   °· Your skin over the hematoma breaks or starts bleeding.   °· Your hematoma is in your chest or abdomen and you have weakness, shortness of breath, or a change in consciousness. °· Your hematoma is on your scalp (caused by a fall or injury) and you have a worsening headache or a change in alertness or consciousness. °MAKE SURE YOU:  °· Understand these instructions. °·   Will watch your condition. °· Will get help right away if you are not doing well or get worse. °Document Released: 12/18/2003 Document Revised: 01/05/2013 Document Reviewed: 10/13/2012 °[employer]® Patient Information ©2015 [employer]. This information is not intended to replace advice given to you by your health care provider. Make sure you discuss any questions you have with your health care provider. ° °

## 2014-10-12 NOTE — ED Notes (Signed)
Pt here with c/o groin pain. Pt states that he had a vasectomy this am and when the local wore off the pain increased. Pt states that he had a hernia on the right side when he was a child and that they had a hard time on the right side today.  Pt states that he went home and took it easy today, but the swelling has increased. Ice was applied to the area. Pt is very tearful, provider made aware.

## 2014-10-18 ENCOUNTER — Emergency Department
Admission: EM | Admit: 2014-10-18 | Discharge: 2014-10-18 | Disposition: A | Discharge status: Home / Self Care | Payer: BLUE CROSS/BLUE SHIELD | Attending: Emergency Medicine | Admitting: Emergency Medicine

## 2014-10-18 DIAGNOSIS — Y836 Removal of other organ (partial) (total) as the cause of abnormal reaction of the patient, or of later complication, without mention of misadventure at the time of the procedure: Secondary | ICD-10-CM | POA: Diagnosis not present

## 2014-10-18 DIAGNOSIS — Z9852 Vasectomy status: Secondary | ICD-10-CM | POA: Insufficient documentation

## 2014-10-18 DIAGNOSIS — Z792 Long term (current) use of antibiotics: Secondary | ICD-10-CM | POA: Insufficient documentation

## 2014-10-18 DIAGNOSIS — S3022XA Contusion of scrotum and testes, initial encounter: Secondary | ICD-10-CM

## 2014-10-18 DIAGNOSIS — N99820 Postprocedural hemorrhage and hematoma of a genitourinary system organ or structure following a genitourinary system procedure: Secondary | ICD-10-CM | POA: Diagnosis not present

## 2014-10-18 MED ORDER — OXYCODONE-ACETAMINOPHEN 5-325 MG PO TABS: 1.0000 | ORAL_TABLET | Freq: Four times a day (QID) | ORAL | Status: DC | PRN

## 2014-10-18 NOTE — ED Notes (Signed)
Pt here with c/o continued swelling and pain to scrotum after having vasectomy last week.  Pt seen MD the next day after surgery and MD told him it was healing ok and looked good.  Pt states pain and swelling got the worse last night it had been yet.

## 2014-10-18 NOTE — Discharge Instructions (Signed)
Hematoma A hematoma is a collection of blood. The collection of blood can turn into a hard, painful lump under the skin. Your skin may turn blue or yellow if the hematoma is close to the surface of the skin. Most hematomas get better in a few days to weeks. Some hematomas are serious and need medical care. Hematomas can be very small or very big. HOME CARE  Apply ice to the injured area:  Put ice in a plastic bag.  Place a towel between your skin and the bag.  Leave the ice on for 20 minutes, 2-3 times a day for the first 1 to 2 days.  After the first 2 days, switch to using warm packs on the injured area.  Raise (elevate) the injured area to lessen pain and puffiness (swelling). You may also wrap the area with an elastic bandage. Make sure the bandage is not wrapped too tight.  If you have a painful hematoma on your leg or foot, you may use crutches for a couple days.  Only take medicines as told by your doctor. GET HELP RIGHT AWAY IF:   Your pain gets worse.  Your pain is not controlled with medicine.  You have a fever.  Your puffiness gets worse.  Your skin turns more blue or yellow.  Your skin over the hematoma breaks or starts bleeding.  Your hematoma is in your chest or belly (abdomen) and you are short of breath, feel weak, or have a change in consciousness.  Your hematoma is on your scalp and you have a headache that gets worse or a change in alertness or consciousness. MAKE SURE YOU:   Understand these instructions.  Will watch your condition.  Will get help right away if you are not doing well or get worse. Document Released: 06/12/2004 Document Revised: 01/05/2013 Document Reviewed: 10/13/2012 [facility] Patient Information 2015 [place]. This information is not intended to replace advice given to you by your health care provider. Make sure you discuss any questions you have with your health care provider.  Scrotal Swelling Scrotal swelling may occur on  one or both sides of the scrotum. Pain may also occur with swelling. Possible causes of scrotal swelling include:   Injury.  Infection.  An ingrown hair or abrasion in the area.  Repeated rubbing from tight-fitting underwear.  Poor hygiene.  A weakened area in the muscles around the groin (hernia). A hernia can allow abdominal contents to push into the scrotum.  Fluid around the testicle (hydrocele).  Enlarged vein around the testicle (varicocele).  Certain medical treatments or existing conditions.  A recent genital surgery or procedure.  The spermatic cord becomes twisted in the scrotum, which cuts off blood supply (testicular torsion).  Testicular cancer. HOME CARE INSTRUCTIONS Once the cause of your scrotal swelling has been determined, you may be asked to monitor your scrotum for any changes. The following actions may help to alleviate any discomfort you are experiencing:  Rest and limit activity until the swelling goes away. Lying down is the preferred position.  Put ice on the scrotum:  Put ice in a plastic bag.  Place a towel between your skin and the bag.  Leave the ice on for 20 minutes, 2-3 times a day for 1-2 days.  Place a rolled towel under the testicles for support.  Wear loose-fitting clothing or an athletic support cup for comfort.  Take all medicines as directed by your health care provider.  Perform a monthly self-exam of the scrotum  and penis. Feel for changes. Ask your health care provider how to perform a monthly self-exam if you are unsure. SEEK MEDICAL CARE IF:  You have a sudden (acute) onset of pain that is persistent and not improving.  You notice a heavy feeling or fluid in the scrotum.  You have pain or burning while urinating.  You have blood in the urine or semen.  You feel a lump around the testicle.  You notice that one testicle is larger than the other (slight variation is normal).  You have a persistent dull ache or pain in  the groin or scrotum. SEEK IMMEDIATE MEDICAL CARE IF:  The pain does not go away or becomes severe.  You have a fever or shaking chills.  You have pain or vomiting that cannot be controlled.  You notice significant redness or swelling of one or both sides of the scrotum.  You experience redness spreading upward from your scrotum to your abdomen or downward from your scrotum to your thighs. MAKE SURE YOU:  Understand these instructions.  Will watch your condition.  Will get help right away if you are not doing well or get worse. Document Released: 06/07/2010 Document Revised: 01/05/2013 Document Reviewed: 10/07/2012 [facility] Patient Information 2015 [place], [place]. This information is not intended to replace advice given to you by your health care provider. Make sure you discuss any questions you have with your health care provider.

## 2014-10-18 NOTE — ED Notes (Signed)
Had a vasectomy last Thursday  conts to have pain and swelling to testicle area

## 2014-10-18 NOTE — ED Provider Notes (Signed)
[facility] Emergency Department Provider Note     Time seen: ----------------------------------------- 8:47 AM on 10/18/2014 -----------------------------------------    I have reviewed the triage vital signs and the nursing notes.   HISTORY  Chief Complaint Post-op Problem    HPI Terry Shaffer is a 34 y.o. male who presents ER for reevaluation of scrotal pain and swelling. Patient states had a vasectomy last Thursday she had persistent swelling and pain and right testicle discoloration. Patient states he went back the next day to see the urologist but continues to have pain swelling. Patient states he was seen the urologist when he was in the ER this past week. The severe pain of the makes it better or worse and can't sleep. Pain medication is not helping him. Most the pain is located in the right hemiscrotum  History reviewed. No pertinent past medical history.  There are no active problems to display for this patient.   Past Surgical History  Procedure Laterality Date  . Vasectomy Bilateral 10/12/14  . Hernia repair      Current Outpatient Rx  Name  Route  Sig  Dispense  Refill  . cephALEXin (KEFLEX) 500 MG capsule   Oral   Take 1 capsule (500 mg total) by mouth 3 (three) times daily.   15 capsule   0   . HYDROcodone-acetaminophen (NORCO/VICODIN) 5-325 MG per tablet   Oral   Take 1 tablet by mouth every 4 (four) hours as needed for moderate pain.   20 tablet   0   . ibuprofen (ADVIL,MOTRIN) 800 MG tablet   Oral   Take 800 mg by mouth every 8 (eight) hours as needed.           Allergies Diphenhydramine and Doxycycline  No family history on file.  Social History History  Substance Use Topics  . Smoking status: Never Smoker   . Smokeless tobacco: Never Used  . Alcohol Use: No    Review of Systems Constitutional: Negative for fever. Eyes: Negative for visual changes. ENT: Negative for sore throat. Cardiovascular:  Negative for chest pain. Respiratory: Negative for shortness of breath. Gastrointestinal: Negative for abdominal pain, vomiting and diarrhea. Genitourinary: Negative for dysuria. Positive for Severe scrotal pain and swelling Musculoskeletal: Negative for back pain. Skin: Negative for rash. Neurological: Negative for headaches, focal weakness or numbness.  10-point ROS otherwise negative.  ____________________________________________   PHYSICAL EXAM:  VITAL SIGNS: ED Triage Vitals  Enc Vitals Group     BP 10/18/14 0800 136/84 mmHg     Pulse Rate 10/18/14 0800 86     Resp 10/18/14 0800 20     Temp 10/18/14 0800 98.1 F (36.7 C)     Temp Source 10/18/14 0800 Oral     SpO2 10/18/14 0800 100 %     Weight 10/18/14 0800 204 lb (92.534 kg)     Height 10/18/14 0800  (1.803 m)     Head Cir --      Peak Flow --      Pain Score 10/18/14 0801 10     Pain Loc --      Pain Edu? --      Excl. in GC? --     Constitutional: Alert and oriented. Mild distress Eyes: Conjunctivae are normal. PERRL. Normal extraocular movements. Gastrointestinal: Soft and nontender. No distention. No abdominal bruits. There is no CVA tenderness. Genitourinary: Patient with markedly edematous and swollen right hemiscrotum, involves penile shaft now. There is some swelling on the  left hemiscrotum as well but not as much. The entire area is exquisitely tender to touch, does not appear to be infected. Most consistent with postop hematoma. Testicle appears to be displaced superiorly Musculoskeletal: Nontender with normal range of motion in all extremities. No joint effusions.  No lower extremity tenderness nor edema. Neurologic:  Normal speech and language.  Skin:  Skin is warm, dry and intact. No rash noted. Psychiatric: Mood and affect are normal.  ____________________________________________    LABS (pertinent positives/negatives)  Labs Reviewed - No data to  display ____________________________________________  ED COURSE:  Pertinent labs & imaging results that were available during my care of the patient were reviewed by me and considered in my medical decision making (see chart for details).  Patient with worsening postop hematoma, will discuss with the urologist. ____________________________________________   RADIOLOGY  None  ____________________________________________    FINAL ASSESSMENT AND PLAN  Postoperative hematoma Plan: Discussed with Dr. Apolinar JunesBrandon urology who did not recommend further ultrasound or testing at this time. Patient is advised this swelling and/or pain will continue for the next several months. We'll prescribe stronger pain medications for him to take at home. He is stable for outpatient follow-up.    Emily FilbertWilliams, Jonathan E, MD   Emily FilbertJonathan E Williams, MD 10/18/14 [phone]

## 2014-10-18 NOTE — ED Notes (Signed)
Pt states he had a vasectomy last Thursday and has had swelling and pain to the right testicle with discoloration, states he went back the next day but continues to have pain and swelling..Marland Kitchen

## 2014-10-23 ENCOUNTER — Emergency Department: Payer: BLUE CROSS/BLUE SHIELD

## 2014-10-23 ENCOUNTER — Emergency Department
Admission: EM | Admit: 2014-10-23 | Discharge: 2014-10-23 | Disposition: A | Discharge status: Home / Self Care | Payer: BLUE CROSS/BLUE SHIELD | Attending: Emergency Medicine | Admitting: Emergency Medicine

## 2014-10-23 ENCOUNTER — Encounter: Payer: Self-pay | Admitting: Emergency Medicine

## 2014-10-23 DIAGNOSIS — N508 Other specified disorders of male genital organs: Secondary | ICD-10-CM | POA: Diagnosis present

## 2014-10-23 DIAGNOSIS — N99821 Postprocedural hemorrhage and hematoma of a genitourinary system organ or structure following other procedure: Secondary | ICD-10-CM | POA: Insufficient documentation

## 2014-10-23 DIAGNOSIS — S3022XA Contusion of scrotum and testes, initial encounter: Secondary | ICD-10-CM

## 2014-10-23 DIAGNOSIS — R609 Edema, unspecified: Secondary | ICD-10-CM

## 2014-10-23 DIAGNOSIS — Y838 Other surgical procedures as the cause of abnormal reaction of the patient, or of later complication, without mention of misadventure at the time of the procedure: Secondary | ICD-10-CM | POA: Diagnosis not present

## 2014-10-23 LAB — COMPREHENSIVE METABOLIC PANEL
ALT: 14 U/L — ABNORMAL LOW (ref 17–63)
AST: 20 U/L (ref 15–41)
Albumin: 4.6 g/dL (ref 3.5–5.0)
Alkaline Phosphatase: 79 U/L (ref 38–126)
Anion gap: 8 (ref 5–15)
BUN: 10 mg/dL (ref 6–20)
CO2: 26 mmol/L (ref 22–32)
Calcium: 9.6 mg/dL (ref 8.9–10.3)
Chloride: 106 mmol/L (ref 101–111)
Creatinine, Ser: 0.93 mg/dL (ref 0.61–1.24)
GFR calc non Af Amer: >60 mL/min (ref >60)
Glucose, Bld: 104 mg/dL — ABNORMAL HIGH (ref 65–99)
Potassium: 3.8 mmol/L (ref 3.5–5.1)
Sodium: 140 mmol/L (ref 135–145)
Total Bilirubin: 0.4 mg/dL (ref 0.3–1.2)
Total Protein: 7.9 g/dL (ref 6.5–8.1)

## 2014-10-23 LAB — CBC WITH DIFFERENTIAL/PLATELET
Basophils Absolute: 0.1 10*3/uL (ref 0–0.1)
Basophils Relative: 1 %
Eosinophils Absolute: 0 10*3/uL (ref 0–0.7)
Eosinophils Relative: 0 %
HEMATOCRIT: 37 % — AB (ref 40.0–52.0)
Hemoglobin: 12.3 g/dL — ABNORMAL LOW (ref 13.0–18.0)
LYMPHS PCT: 25 %
Lymphs Abs: 2.5 10*3/uL (ref 1.0–3.6)
MCH: 27.1 pg (ref 26.0–34.0)
MCHC: 33.2 g/dL (ref 32.0–36.0)
MCV: 81.4 fL (ref 80.0–100.0)
Monocytes Absolute: 0.8 10*3/uL (ref 0.2–1.0)
Monocytes Relative: 8 %
NEUTROS ABS: 6.8 10*3/uL — AB (ref 1.4–6.5)
Neutrophils Relative %: 66 %
PLATELETS: 290 10*3/uL (ref 150–440)
RBC: 4.55 MIL/uL (ref 4.40–5.90)
RDW: 14.4 % (ref 11.5–14.5)
WBC: 10.1 10*3/uL (ref 3.8–10.6)

## 2014-10-23 MED ORDER — OXYCODONE-ACETAMINOPHEN 5-325 MG PO TABS
2.0000 | ORAL_TABLET | Freq: Once | ORAL | Status: AC
  Administered 2014-10-23: 2 via ORAL

## 2014-10-23 MED ORDER — HYDROMORPHONE HCL 1 MG/ML IJ SOLN
INTRAMUSCULAR | Status: AC
  Administered 2014-10-23: 1 mg via INTRAVENOUS
  Filled 2014-10-23: qty 1

## 2014-10-23 MED ORDER — HYDROMORPHONE HCL 1 MG/ML IJ SOLN
1.0000 mg | Freq: Once | INTRAMUSCULAR | Status: AC
  Administered 2014-10-23: 1 mg via INTRAVENOUS

## 2014-10-23 MED ORDER — OXYCODONE HCL 5 MG PO TABS: ORAL_TABLET | ORAL | Status: DC

## 2014-10-23 MED ORDER — OXYCODONE-ACETAMINOPHEN 5-325 MG PO TABS
ORAL_TABLET | ORAL | Status: AC
  Administered 2014-10-23: 2 via ORAL
  Filled 2014-10-23: qty 2

## 2014-10-23 NOTE — Discharge Instructions (Signed)
I discussed your case with [facility], Dr. Marcine MatarStephen Dahlstedt, who advised you to eat/drink nothing after midnight and call the office at 8 AM to be worked in with a possible plan of surgical exploration. DO NOT take aspirin, indocet, advil, aleve, or anything in the NSAID category for pain. Use only tylenol &/or narcotics.

## 2014-10-23 NOTE — ED Provider Notes (Signed)
[facility] Emergency Department Provider Note  ____________________________________________  Time seen: Approximately 9:30 PM  I have reviewed the triage vital signs and the nursing notes.   HISTORY  Chief Complaint Testicle Pain   HPI Terry Shaffer is a 34 y.o. male with no significant past medical history who had a mastectomy on 5/26 in the [facility] office with Dr. Apolinar JunesBrandon and later that day presented to the ER due to marked swelling and was found to have a large hematoma on ultrasound. Patient continued to have pain and swelling and saw Dr. Ronne BinningMcKenzie in the [facility] in [place] on [place]3 who drained the hematoma and pack the wound. He instructed Mr. Christella NoaSwann to change the packing daily for 10 days. Mr. Christella NoaSwann notes that there has been no improvement in the hematoma and the pain is severe. He has not had relief with Norco, Endocet, aspirin, or ibuprofen. He has been on Cipro since 5/26. He notes that there has been no purulent drainage but rather only blood. He denies any fevers, vomiting, abdominal pain. Pain is only in the right scrotum.   History reviewed. No pertinent past medical history.  There are no active problems to display for this patient.   Past Surgical History  Procedure Laterality Date  . Vasectomy Bilateral 10/12/14  . Hernia repair    kidney stone removal  Current Outpatient Rx  Name  Route  Sig  Dispense  Refill  . HYDROcodone-acetaminophen (NORCO/VICODIN) 5-325 MG per tablet   Oral   Take 1 tablet by mouth every 4 (four) hours as needed for moderate pain.   20 tablet   0   . ibuprofen (ADVIL,MOTRIN) 800 MG tablet   Oral   Take 800 mg by mouth every 8 (eight) hours as needed for mild pain.          Marland Kitchen. oxyCODONE-acetaminophen (ROXICET) 5-325 MG per tablet   Oral   Take 1 tablet by mouth every 6 (six) hours as needed.   30 tablet   0   . oxyCODONE (ROXICODONE) 5 MG immediate release tablet      1-2 tabs every  6 hours as needed for severe pain. Do not drive or operate machinery while on this medication. Use stool softeners as medication may cause constipation.   20 tablet   0     Allergies Diphenhydramine and Doxycycline  No family history on file.  Social History History  Substance Use Topics  . Smoking status: Never Smoker   . Smokeless tobacco: Never Used  . Alcohol Use: No    Review of Systems Constitutional: No fever/chills  Gastrointestinal: No abdominal pain.  No nausea, no vomiting.   Genitourinary: see hpi Musculoskeletal: Negative for back pain. Skin: scrotal skin has been reddish since procedure & remains unchanged 10-point ROS otherwise negative.  ____________________________________________   PHYSICAL EXAM:  VITAL SIGNS: ED Triage Vitals  Enc Vitals Group     BP 10/23/14 1836 142/92 mmHg     Pulse Rate 10/23/14 1834 82     Resp 10/23/14 1834 18     Temp 10/23/14 1834 98.4 F (36.9 C)     Temp Source 10/23/14 1834 Oral     SpO2 10/23/14 1834 100 %     Weight 10/23/14 1834 206 lb (93.441 kg)     Height 10/23/14 1834 5\' 11"  (1.803 m)     Head Cir --      Peak Flow --      Pain Score 10/23/14 1835  10     Pain Loc --      Pain Edu? --      Excl. in GC? --     Constitutional: Alert and oriented. Well appearing and in no acute distress.  Head: Atraumatic. Respiratory: Normal respiratory effort.   Gastrointestinal: Soft and nontender.  Genitourinary: marked swelling in R scrotum; tenderness over hematoma; 1 cm incision R side of scrotum with packing visible Musculoskeletal: No lower extremity edema.   Neurologic:  Normal speech and language. No gross focal neurologic deficits are appreciated. Speech is normal. Skin:  Skin is warm, dry and intact. No rash noted. Psychiatric: Mood and affect are normal. Speech and behavior are normal.  ____________________________________________   LABS (all labs ordered are listed, but only abnormal results are  displayed)  Labs Reviewed  CBC WITH DIFFERENTIAL/PLATELET - Abnormal; Notable for the following:    Hemoglobin 12.3 (*)    HCT 37.0 (*)    Neutro Abs 6.8 (*)    All other components within normal limits  COMPREHENSIVE METABOLIC PANEL - Abnormal; Notable for the following:    Glucose, Bld 104 (*)    ALT 14 (*)    All other components within normal limits   ____________________________________________   INITIAL IMPRESSION / ASSESSMENT AND PLAN / ED COURSE  Pertinent labs & imaging results that were available during my care of the patient were reviewed by me and considered in my medical decision making (see chart for details).   I advised pt to avoid NSAIDs/ASA  At 10:10 PM, I discussed with [facility], Dr. Marcine Matar, who advised pt to remain nothing by mouth after midnight and call the office at 8 AM to be worked in with a possible plan of exploration. Patient and his brother were updated. ____________________________________________   FINAL CLINICAL IMPRESSION(S) / ED DIAGNOSES  Final diagnoses:  Scrotal hematoma      Maurilio Lovely, MD 10/23/14 2309

## 2014-10-23 NOTE — ED Notes (Signed)
Pt states he had a vasectomy on 5/26 and afterwards had swelling and pain to right scrotum, states he came to ED and the on-call urologist came in and saw him, states on last Friday he had procedure to drain hematoma from right scrotum by Dr. Ronne BinningMckenzie -urology and states since that procedure he continues to have pain and swelling, states he tried to call Dr. Serita KyleMakenzie three times today with no return phone call, denies urinary symtpoms

## 2014-10-23 NOTE — ED Notes (Signed)
Patient with no complaints at this time. Respirations even and unlabored. Skin warm/dry. Discharge instructions reviewed with patient at this time. Patient given opportunity to voice concerns/ask questions. Patient discharged at this time and left Emergency Department with steady gait.   

## 2014-11-13 ENCOUNTER — Telehealth: Payer: Self-pay | Admitting: Family Medicine

## 2014-11-13 NOTE — Telephone Encounter (Signed)
Patient had a Vasectomy by Dr. Apolinar Junes done on 10/12/2014. He was out of work 10/12/2014 through October 20, 2014. He needs a doctor note to cover these days if possible.

## 2014-11-13 NOTE — Telephone Encounter (Signed)
Spoke with patient and stated that the note is available for him to pick up at his convenience, patient states that he is following up at [facility] and we can cancel his follow up apt for tomorrow.

## 2014-11-14 ENCOUNTER — Encounter: Payer: Self-pay | Admitting: Urology

## 2015-02-07 ENCOUNTER — Emergency Department: Payer: BLUE CROSS/BLUE SHIELD

## 2015-02-07 ENCOUNTER — Emergency Department
Admission: EM | Admit: 2015-02-07 | Discharge: 2015-02-07 | Disposition: A | Discharge status: Home / Self Care | Payer: BLUE CROSS/BLUE SHIELD | Attending: Internal Medicine | Admitting: Internal Medicine

## 2015-02-07 DIAGNOSIS — Y9289 Other specified places as the place of occurrence of the external cause: Secondary | ICD-10-CM | POA: Insufficient documentation

## 2015-02-07 DIAGNOSIS — S39012A Strain of muscle, fascia and tendon of lower back, initial encounter: Secondary | ICD-10-CM | POA: Insufficient documentation

## 2015-02-07 DIAGNOSIS — Y998 Other external cause status: Secondary | ICD-10-CM | POA: Insufficient documentation

## 2015-02-07 DIAGNOSIS — X58XXXA Exposure to other specified factors, initial encounter: Secondary | ICD-10-CM | POA: Insufficient documentation

## 2015-02-07 DIAGNOSIS — R202 Paresthesia of skin: Secondary | ICD-10-CM | POA: Diagnosis not present

## 2015-02-07 DIAGNOSIS — M79609 Pain in unspecified limb: Secondary | ICD-10-CM

## 2015-02-07 DIAGNOSIS — M79604 Pain in right leg: Secondary | ICD-10-CM | POA: Diagnosis present

## 2015-02-07 DIAGNOSIS — Y9389 Activity, other specified: Secondary | ICD-10-CM | POA: Diagnosis not present

## 2015-02-07 LAB — URINALYSIS COMPLETE WITH MICROSCOPIC (ARMC ONLY)
BACTERIA UA: NONE SEEN
Bilirubin Urine: NEGATIVE
Glucose, UA: NEGATIVE mg/dL
Hgb urine dipstick: NEGATIVE
Ketones, ur: NEGATIVE mg/dL
Nitrite: NEGATIVE
Protein, ur: NEGATIVE mg/dL
SQUAMOUS EPITHELIAL / LPF: NONE SEEN
Specific Gravity, Urine: 1.021 (ref 1.005–1.030)
pH: 7 (ref 5.0–8.0)

## 2015-02-07 MED ORDER — KETOROLAC TROMETHAMINE 10 MG PO TABS: 10.0000 mg | ORAL_TABLET | Freq: Three times a day (TID) | ORAL | Status: DC

## 2015-02-07 MED ORDER — HYDROCODONE-ACETAMINOPHEN 5-325 MG PO TABS
1.0000 | ORAL_TABLET | Freq: Three times a day (TID) | ORAL | Status: DC | PRN
Start: 2015-02-07 — End: 2015-08-03

## 2015-02-07 MED ORDER — KETOROLAC TROMETHAMINE 60 MG/2ML IM SOLN
60.0000 mg | Freq: Once | INTRAMUSCULAR | Status: AC
  Administered 2015-02-07: 60 mg via INTRAMUSCULAR
  Filled 2015-02-07: qty 2

## 2015-02-07 MED ORDER — ORPHENADRINE CITRATE ER 100 MG PO TB12: 100.0000 mg | ORAL_TABLET | Freq: Two times a day (BID) | ORAL | Status: DC | PRN

## 2015-02-07 NOTE — Discharge Instructions (Signed)
Lumbosacral Strain Lumbosacral strain is a strain of any of the parts that make up your lumbosacral vertebrae. Your lumbosacral vertebrae are the bones that make up the lower third of your backbone. Your lumbosacral vertebrae are held together by muscles and tough, fibrous tissue (ligaments).  CAUSES  A sudden blow to your back can cause lumbosacral strain. Also, anything that causes an excessive stretch of the muscles in the low back can cause this strain. This is typically seen when people exert themselves strenuously, fall, lift heavy objects, bend, or crouch repeatedly. RISK FACTORS  Physically demanding work.  Participation in pushing or pulling sports or sports that require a sudden twist of the back (tennis, golf, baseball).  Weight lifting.  Excessive lower back curvature.  Forward-tilted pelvis.  Weak back or abdominal muscles or both.  Tight hamstrings. SIGNS AND SYMPTOMS  Lumbosacral strain may cause pain in the area of your injury or pain that moves (radiates) down your leg.  DIAGNOSIS Your health care provider can often diagnose lumbosacral strain through a physical exam. In some cases, you may need tests such as X-ray exams.  TREATMENT  Treatment for your lower back injury depends on many factors that your clinician will have to evaluate. However, most treatment will include the use of anti-inflammatory medicines. HOME CARE INSTRUCTIONS   Avoid hard physical activities (tennis, racquetball, waterskiing) if you are not in proper physical condition for it. This may aggravate or create problems.  If you have a back problem, avoid sports requiring sudden body movements. Swimming and walking are generally safer activities.  Maintain good posture.  Maintain a healthy weight.  For acute conditions, you may put ice on the injured area.  Put ice in a plastic bag.  Place a towel between your skin and the bag.  Leave the ice on for 20 minutes, 2-3 times a day.  When the  low back starts healing, stretching and strengthening exercises may be recommended. SEEK MEDICAL CARE IF:  Your back pain is getting worse.  You experience severe back pain not relieved with medicines. SEEK IMMEDIATE MEDICAL CARE IF:   You have numbness, tingling, weakness, or problems with the use of your arms or legs.  There is a change in bowel or bladder control.  You have increasing pain in any area of the body, including your belly (abdomen).  You notice shortness of breath, dizziness, or feel faint.  You feel sick to your stomach (nauseous), are throwing up (vomiting), or become sweaty.  You notice discoloration of your toes or legs, or your feet get very cold. MAKE SURE YOU:   Understand these instructions.  Will watch your condition.  Will get help right away if you are not doing well or get worse. Document Released: 02/12/2005 Document Revised: 05/10/2013 Document Reviewed: 12/22/2012 [facility] Patient Information 2015 [place], [place]. This information is not intended to replace advice given to you by your health care provider. Make sure you discuss any questions you have with your health care provider.  Paresthesia Paresthesia is a burning or prickling feeling. This feeling can happen in any part of the body. It often happens in the hands, arms, legs, or feet. HOME CARE  Avoid drinking alcohol.  Try massage or needle therapy (acupuncture) to help with your problems.  Keep all doctor visits as told. GET HELP RIGHT AWAY IF:   You feel weak.  You have trouble walking or moving.  You have problems speaking or seeing.  You feel confused.  You cannot control  when you poop (bowel movement) or pee (urinate).  You lose feeling (numbness) after an injury.  You pass out (faint).  Your burning or prickling feeling gets worse when you walk.  You have pain, cramps, or feel dizzy.  You have a rash. MAKE SURE YOU:   Understand these instructions.  Will watch  your condition.  Will get help right away if you are not doing well or get worse. Document Released: 04/17/2008 Document Revised: 07/28/2011 Document Reviewed: 01/24/2011 [facility] Patient Information 2015 [place], [place]. This information is not intended to replace advice given to you by your health care provider. Make sure you discuss any questions you have with your health care provider.  Take the prescription meds as directed.  Follow-up with your primary provider as needed for continued symptoms.

## 2015-02-07 NOTE — ED Notes (Signed)
Pt states has numbness and pain to right leg since midnight, hx of the same was told it was his back.

## 2015-02-07 NOTE — ED Notes (Signed)
Developed right lower back and leg pain and numbness last pm  Denies any injury unable to bear full wt.

## 2015-02-07 NOTE — ED Provider Notes (Signed)
[facility] Emergency Department Provider Note ____________________________________________  Time seen: 0710  I have reviewed the triage vital signs and the nursing notes.  HISTORY  Chief Complaint  Leg Pain  HPI Terry Shaffer is a 34 y.o. male reports to the ED with right leg numbness since about midnight. He reports normal activity, and function throughout the day. By the time he prepared himself for bed around midnight, he noted a sudden onset of numbness and tingling to the right lower extremity. He also claims some cold sensation to the leg. The symptoms continued and he presents today for evaluation and management. He localizes the primary achiness to the lower leg from the knee to the foot. He reports pain is increased with attempts to bear weight leg.He denies any known injury, foot drop, or incontinence. He also denies any known trauma or injury. He's had one similar episode about a year ago, and was treated for an acute sciatic irritation. Since that time he has been without any intermittent ongoing back or leg pains.  No past medical history on file.  There are no active problems to display for this patient.  Past Surgical History  Procedure Laterality Date  . Vasectomy Bilateral 10/12/14  . Hernia repair      Current Outpatient Rx  Name  Route  Sig  Dispense  Refill  . HYDROcodone-acetaminophen (NORCO) 5-325 MG per tablet   Oral   Take 1 tablet by mouth every 8 (eight) hours as needed for moderate pain.   12 tablet   0   . ibuprofen (ADVIL,MOTRIN) 800 MG tablet   Oral   Take 800 mg by mouth every 8 (eight) hours as needed for mild pain.          Marland Kitchen ketorolac (TORADOL) 10 MG tablet   Oral   Take 1 tablet (10 mg total) by mouth every 8 (eight) hours.   15 tablet   0   . orphenadrine (NORFLEX) 100 MG tablet   Oral   Take 1 tablet (100 mg total) by mouth 2 (two) times daily as needed for muscle spasms.   20 tablet   0     Allergies Diphenhydramine and Doxycycline  No family history on file.  Social History Social History  Substance Use Topics  . Smoking status: Never Smoker   . Smokeless tobacco: Never Used  . Alcohol Use: No   Review of Systems  Constitutional: Negative for fever. Eyes: Negative for visual changes. ENT: Negative for sore throat. Cardiovascular: Negative for chest pain. Respiratory: Negative for shortness of breath. Gastrointestinal: Negative for abdominal pain, vomiting and diarrhea. Genitourinary: Negative for dysuria. Musculoskeletal: Negative for back pain. Right lower extremity paresthesias. Skin: Negative for rash. Neurological: Negative for headaches, focal weakness or numbness. ____________________________________________  PHYSICAL EXAM:  VITAL SIGNS: ED Triage Vitals  Enc Vitals Group     BP 02/07/15 0618 132/93 mmHg     Pulse Rate 02/07/15 0618 95     Resp 02/07/15 0618 18     Temp 02/07/15 0618 98 F (36.7 C)     Temp src --      SpO2 02/07/15 0618 97 %     Weight 02/07/15 0618 206 lb (93.441 kg)     Height 02/07/15 0618  (1.803 m)     Head Cir --      Peak Flow --      Pain Score 02/07/15 0618 9     Pain Loc --  Pain Edu? --      Excl. in GC? --    Constitutional: Alert and oriented. Well appearing and in no distress. Eyes: Conjunctivae are normal. PERRL. Normal extraocular movements. ENT   Head: Normocephalic and atraumatic.   Nose: No congestion/rhinorrhea.   Mouth/Throat: Mucous membranes are moist.   Neck: Supple. No thyromegaly. Hematological/Lymphatic/Immunological: No cervical lymphadenopathy. Cardiovascular: Normal rate, regular rhythm. Normal distal pulses. Normal capillary refill the feet. Respiratory: Normal respiratory effort. No wheezes/rales/rhonchi. Gastrointestinal: Soft and nontender. No distention. Musculoskeletal: Normal spinal alignment without deformity, spasm, midline tenderness or step-off. Negative  straight leg raise. Normal toe and heel raise. Normal lumbar flexion and extension range exam. Nontender with normal range of motion in all extremities.  Neurologic:  Cranial nerves II through XII grossly intact. Normal lotion DTRs bilaterally. Normal toe dorsiflexion. Normal gait without ataxia. Normal speech and language. No gross focal neurologic deficits are appreciated. Skin:  Skin is warm, dry and intact. No rash noted. Psychiatric: Mood and affect are normal. Patient exhibits appropriate insight and judgment. ____________________________________________  LABS (pertinent positives/negatives) Labs Reviewed  URINALYSIS COMPLETEWITH MICROSCOPIC ([facility] ONLY) - Abnormal; Notable for the following:    Color, Urine YELLOW (*)    APPearance CLEAR (*)    Leukocytes, UA TRACE (*)    All other components within normal limits  ____________________________________________   RADIOLOGY LS Spine IMPRESSION: No acute findings  I, Menshew, Charlesetta Ivory, personally viewed and evaluated these images (plain radiographs) as part of my medical decision making.  ____________________________________________  PROCEDURES  Toradol 60 mg IM ____________________________________________  INITIAL IMPRESSION / ASSESSMENT AND PLAN / ED COURSE  Acute right leg paresthesias without indication of acute spinal injury, fracture, or degenerative disc disease. Symptoms likely due to sacral nerve irritation.Will treat lumbosacral strain with RLE referral with Toradol, Norflex, and Norco. Follow-up with primary provider as needed.  ____________________________________________  FINAL CLINICAL IMPRESSION(S) / ED DIAGNOSES  Final diagnoses:  Lumbar strain, initial encounter  Paresthesia and pain of right extremity      Lissa Hoard, PA-C 02/07/15 0912  Emily Filbert, MD 02/07/15 1520

## 2015-03-16 IMAGING — CR LEFT WRIST - COMPLETE 3+ VIEW
1 series · 5 of 5 positions shown · non-contrast
Comparison: none

REASON FOR EXAM: wrist pain of unknown origin
COMMENTS:   LMP: (Male)

PROCEDURE:     DXR - DXR WRIST LT COMP WITH OBLIQUES  - January 27, 2013  [DATE]
RESULT:     Five views of the left wrist reveal the bones to be adequately
mineralized. There is no evidence of an acute fracture nor dislocation. The
overlying soft tissues are normal in appearance.

[Series 1: x wrist pa left · 0.14mm/px · 5 of 5 slices shown]
[im 1/5]
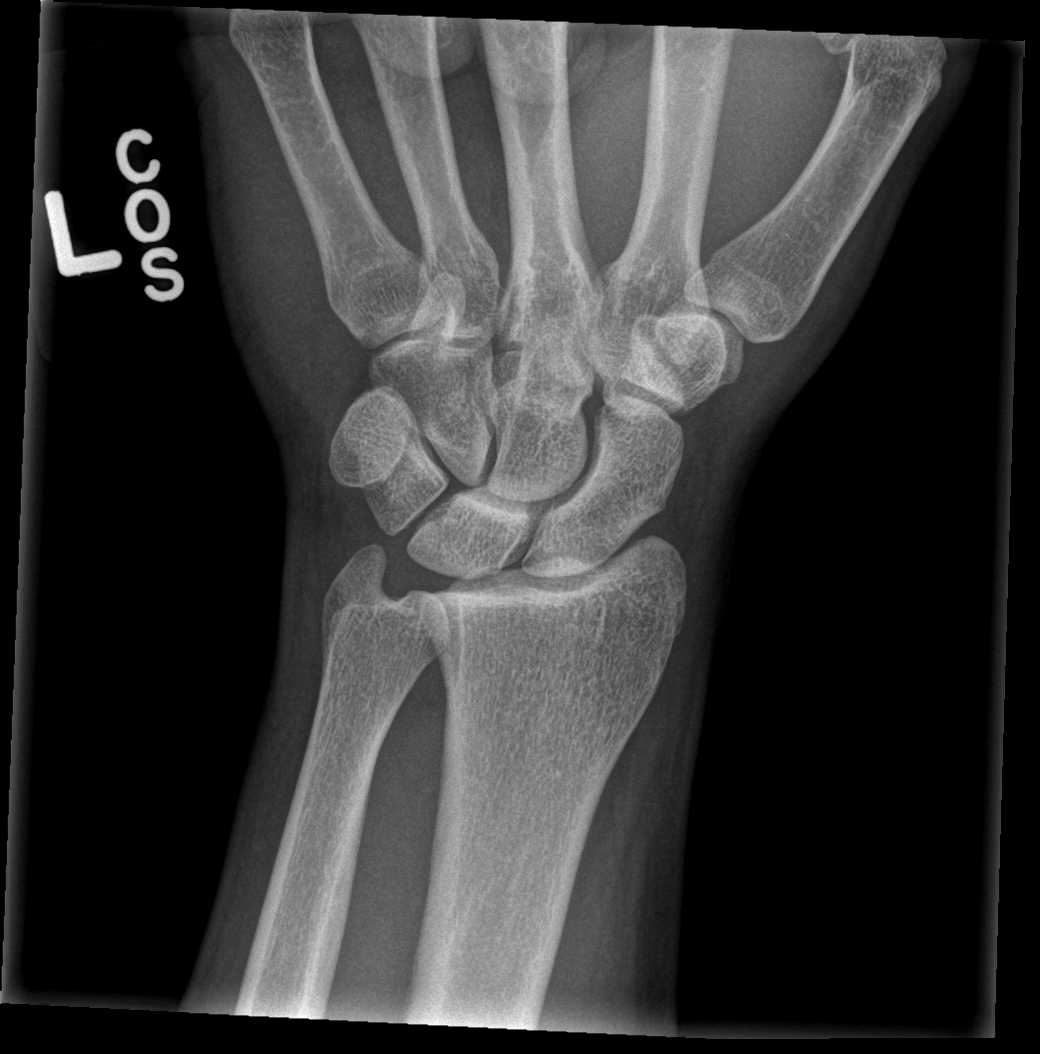
[im 2/5]
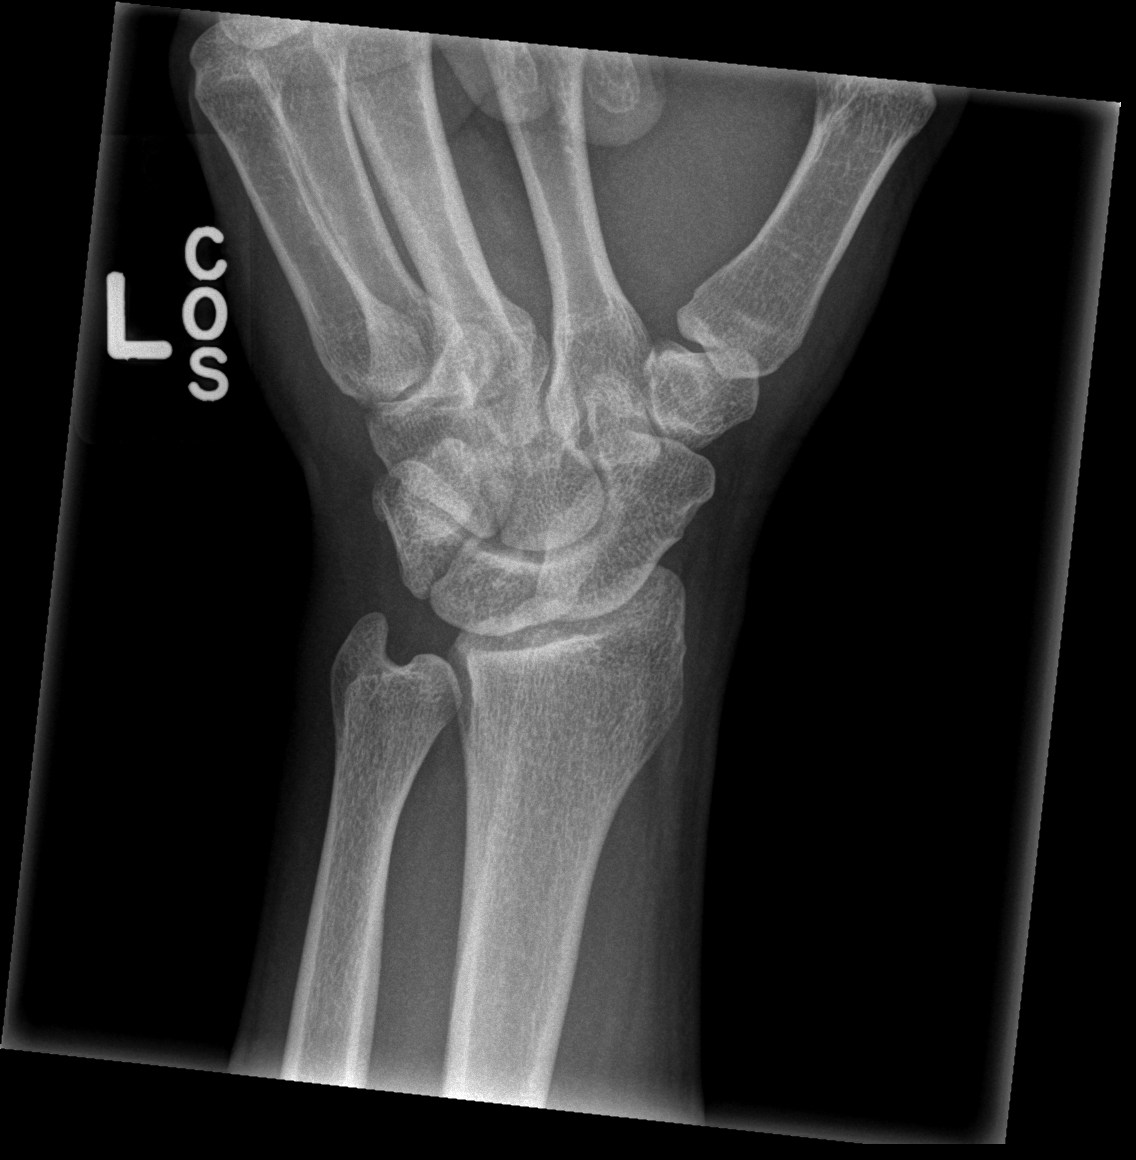
[im 3/5]
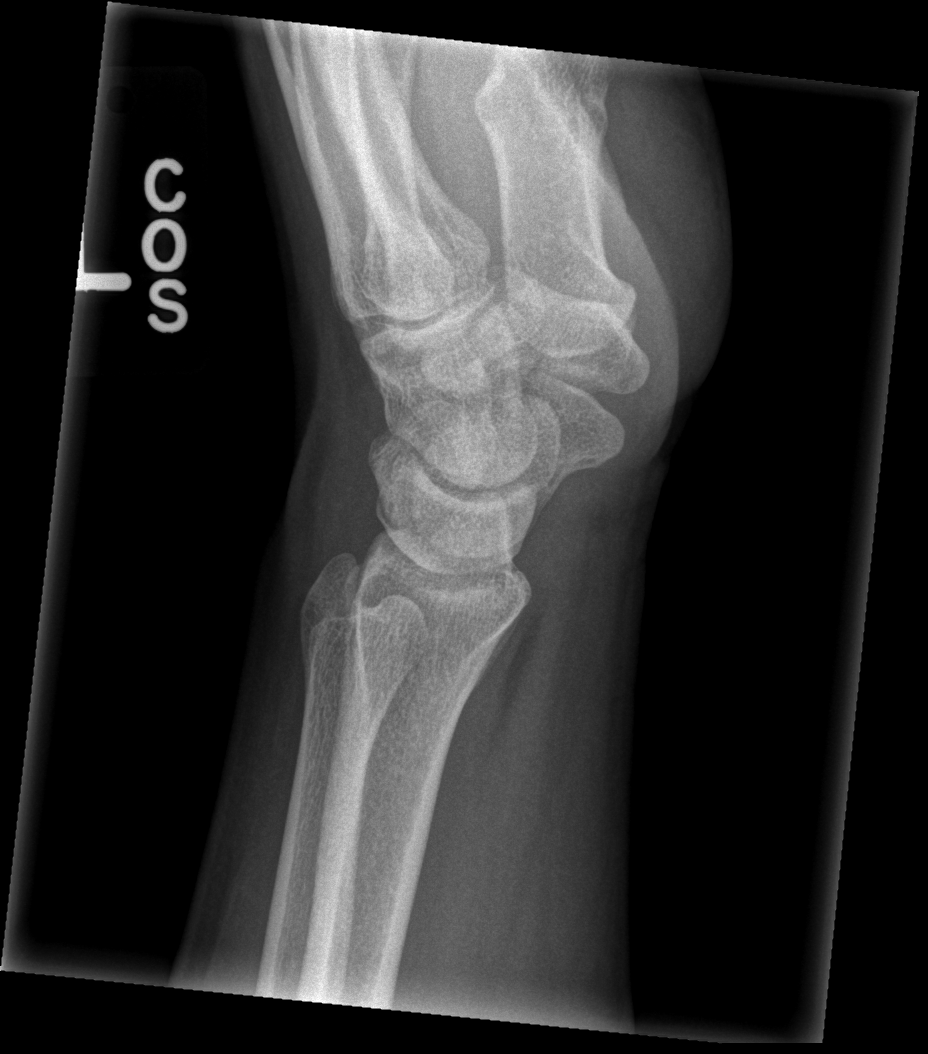
[im 4/5]
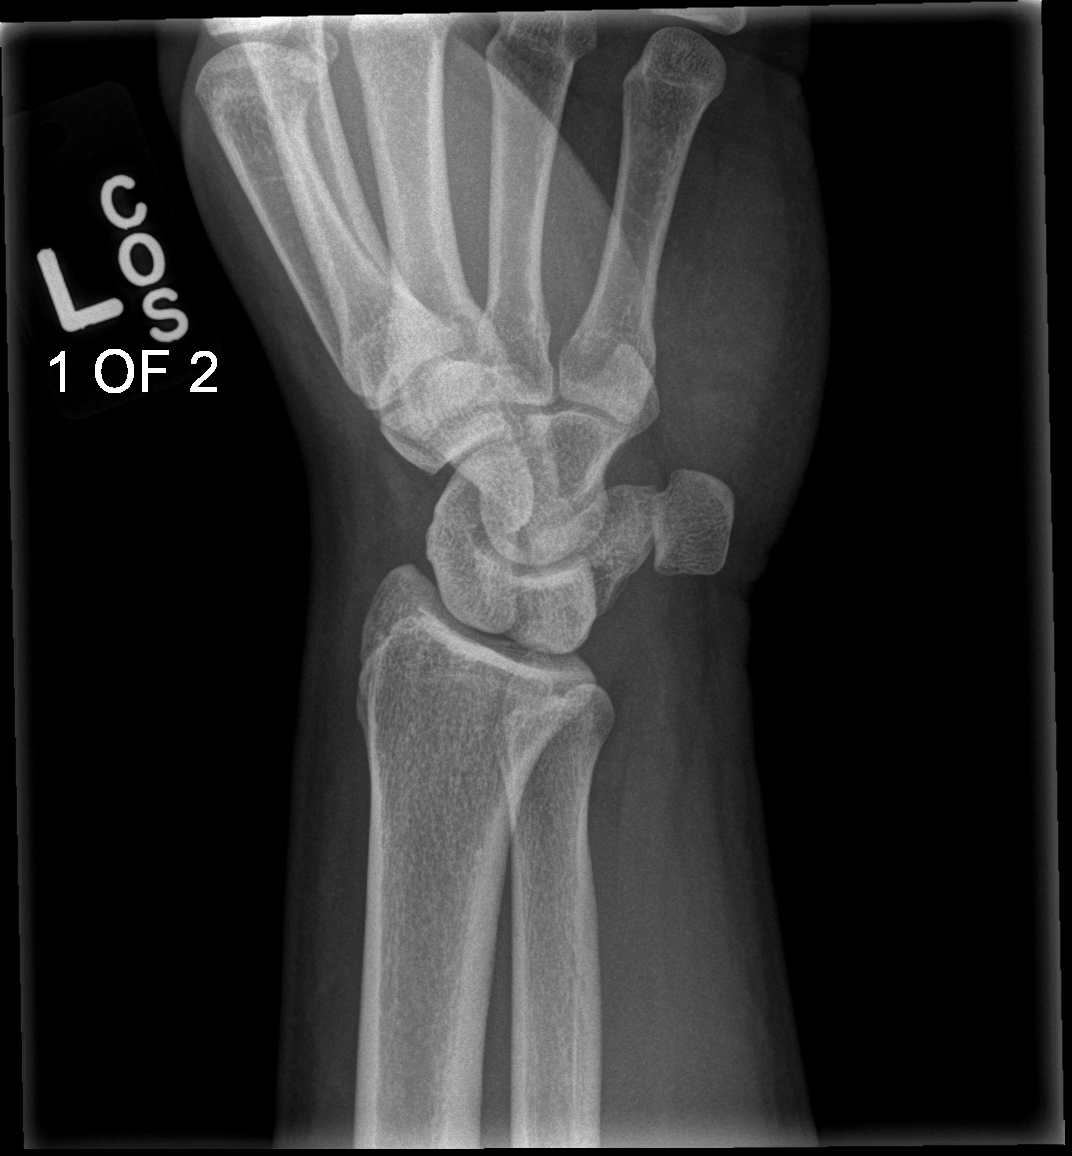
[im 5/5]
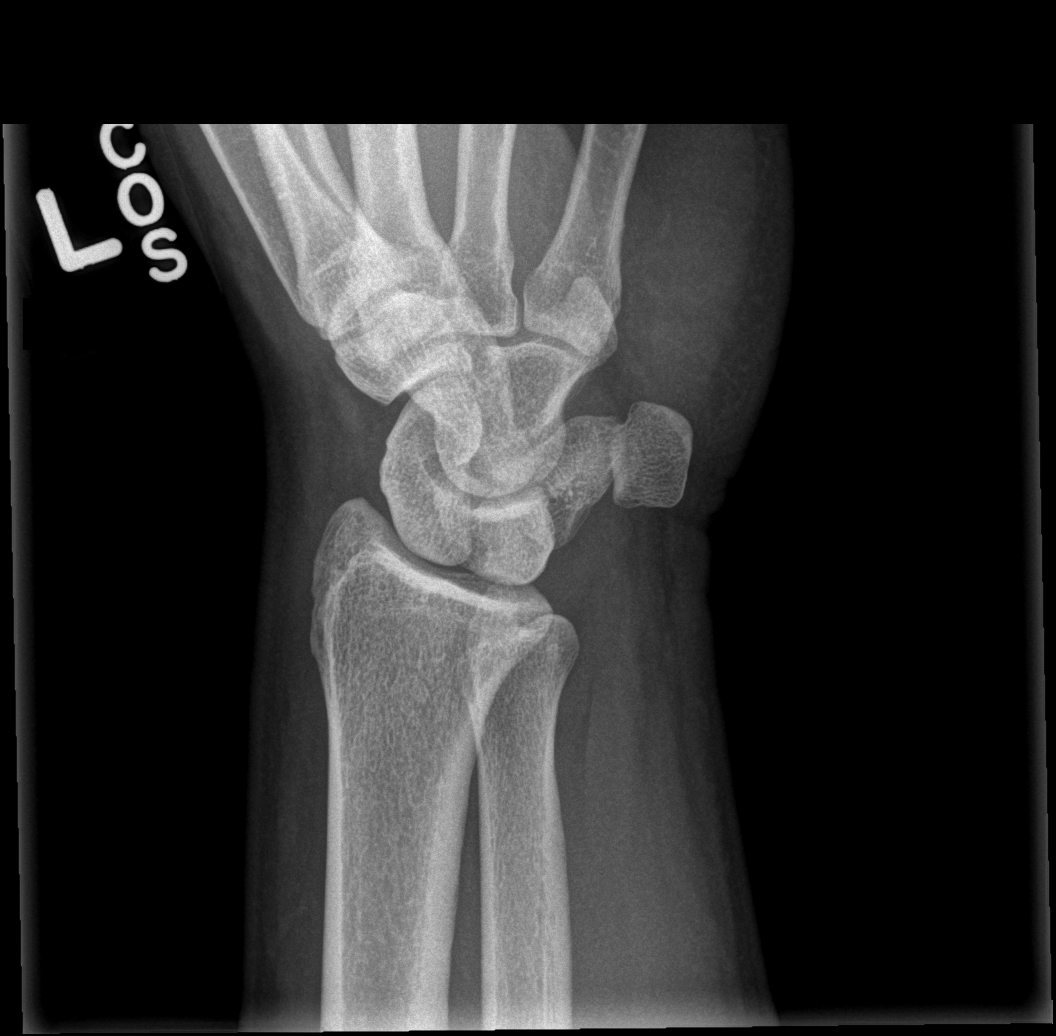

[5 of 5 positions shown; findings below may reference images not displayed]

IMPRESSION: There is no acute bony abnormality of the left wrist.

If the patient's symptoms persist and remain unexplained, followup MRI may
be useful.

[REDACTED]

## 2015-03-17 ENCOUNTER — Encounter: Payer: Self-pay | Admitting: *Deleted

## 2015-03-17 ENCOUNTER — Ambulatory Visit
Admission: EM | Admit: 2015-03-17 | Discharge: 2015-03-17 | Disposition: A | Discharge status: Home / Self Care | Payer: BLUE CROSS/BLUE SHIELD | Attending: Family Medicine | Admitting: Family Medicine

## 2015-03-17 DIAGNOSIS — L600 Ingrowing nail: Secondary | ICD-10-CM | POA: Diagnosis not present

## 2015-03-17 MED ORDER — TRAMADOL HCL 50 MG PO TABS: 50.0000 mg | ORAL_TABLET | Freq: Four times a day (QID) | ORAL | Status: DC | PRN

## 2015-03-17 MED ORDER — MELOXICAM 15 MG PO TABS: 15.0000 mg | ORAL_TABLET | Freq: Every day | ORAL | Status: DC

## 2015-03-17 MED ORDER — CEFUROXIME AXETIL 500 MG PO TABS: 500.0000 mg | ORAL_TABLET | Freq: Two times a day (BID) | ORAL | Status: DC

## 2015-03-17 NOTE — ED Notes (Signed)
Pt states he has an ingrown toenail on the right great toe.

## 2015-03-17 NOTE — Discharge Instructions (Signed)
Ingrown Toenail °An ingrown toenail occurs when the corner or sides of your toenail grow into the surrounding skin. The big toe is most commonly affected, but it can happen to any of your toes. If your ingrown toenail is not treated, you will be at risk for infection. °CAUSES °This condition may be caused by: °· Wearing shoes that are too small or tight. °· Injury or trauma, such as stubbing your toe or having your toe stepped on. °· Improper cutting or care of your toenails. °· Being born with (congenital) nail or foot abnormalities, such as having a nail that is too big for your toe. °RISK FACTORS °Risk factors for an ingrown toenail include: °· Age. Your nails tend to thicken as you get older, so ingrown nails are more common in older people. °· Diabetes. °· Cutting your toenails incorrectly. °· Blood circulation problems. °SYMPTOMS °Symptoms may include: °· Pain, soreness, or tenderness. °· Redness. °· Swelling. °· Hardening of the skin surrounding the toe. °Your ingrown toenail may be infected if there is fluid, pus, or drainage. °DIAGNOSIS  °An ingrown toenail may be diagnosed by medical history and physical exam. If your toenail is infected, your health care provider may test a sample of the drainage. °TREATMENT °Treatment depends on the severity of your ingrown toenail. Some ingrown toenails may be treated at home. More severe or infected ingrown toenails may require surgery to remove all or part of the nail. Infected ingrown toenails may also be treated with antibiotic medicines. °HOME CARE INSTRUCTIONS °· If you were prescribed an antibiotic medicine, finish all of it even if you start to feel better. °· Soak your foot in warm soapy water for 20 minutes, 3 times per day or as directed by your health care provider. °· Carefully lift the edge of the nail away from the sore skin by wedging a small piece of cotton under the corner of the nail. This may help with the pain.  Be careful not to cause more injury  to the area. °· Wear shoes that fit well. If your ingrown toenail is causing you pain, try wearing sandals, if possible. °· Trim your toenails regularly and carefully. Do not cut them in a curved shape. Cut your toenails straight across. This prevents injury to the skin at the corners of the toenail. °· Keep your feet clean and dry. °· If you are having trouble walking and are given crutches by your health care provider, use them as directed. °· Do not pick at your toenail or try to remove it yourself. °· Take medicines only as directed by your health care provider. °· Keep all follow-up visits as directed by your health care provider. This is important. °SEEK MEDICAL CARE IF: °· Your symptoms do not improve with treatment. °SEEK IMMEDIATE MEDICAL CARE IF: °· You have red streaks that start at your foot and go up your leg. °· You have a fever. °· You have increased redness, swelling, or pain. °· You have fluid, blood, or pus coming from your toenail. °  °This information is not intended to replace advice given to you by your health care provider. Make sure you discuss any questions you have with your health care provider. °  °Document Released: 05/02/2000 Document Revised: 09/19/2014 Document Reviewed: 03/29/2014 °Elsevier Interactive Patient Education ©2016 Elsevier Inc. ° °Fingernail or Toenail Removal °Fingernail or toenail removal is a surgical procedure to take off a nail from your finger or your toe. You may need to have a fingernail or   toenail removed if it has an abnormal shape (deformity) or if it is severely injured. A fingernail or toenail may also be removed due to a bacterial infection, a severe ingrown toenail, or a fungal infection that has failed treatment with antifungal medicines. LET [facility] PROVID[facility] KNOW ABOUT:  Any allergies you have.  All medicines you are taking, including vitamins, herbs, eye drops, creams, and over-the-counter medicines.  Previous problems you or members of  your family have had with the use of anesthetics.  Any blood disorders you have.  Previous surgeries you have had.  Any medical conditions you may have. RISKS AND COMPLICATIONS Generally, this is a safe procedure. However, problems may occur, including:  Pain.  Bleeding.  Infection.  Regrowth of a deformed nail. BEFORE THE PROCEDURE  Ask your health care provider about changing or stopping your regular medicines. This is especially important if you are taking diabetes medicines or blood thinners.  Follow instructions from your health care provider about eating or drinking restrictions.  Plan to have someone take you home after the procedure. PROCEDURE  An IV tube will be inserted into one of your veins.  You will be given one or more of the following:  A medicine that helps you relax (sedative).  A medicine that numbs the area (local anesthetic).  After your toe or finger is numb, your health care provider will insert a blunt instrument under your nail to lift it up.  In some cases, your health care provider may also make a cut (incision) in your nail.  After your nail is lifted away from your toe or finger, your health care provider will detach it from your nail bed.  A germ-killing bandage (antiseptic dressing) will be put on your toe or finger. The procedure may vary among health care providers and hospitals. AFTER THE PROCEDURE  Your blood pressure, heart rate, breathing rate, and blood oxygen level will be monitored often until the medicines you were given have worn off.  It is common to have some pain after nail removal. You will be given pain medicine as needed.  You may be given a prescription for pain medicine and antibiotic medicine.  If you had a toenail removed, you will be given a surgical shoe to wear while you recover.  If you had a fingernail removed, you may be given a finger splint to wear while you recover.   This information is not intended to  replace advice given to you by your health care provider. Make sure you discuss any questions you have with your health care provider.   Document Released: 02/01/2003 Document Revised: 09/19/2014 Document Reviewed: 05/03/2014 Elsevier Interactive Patient Education [employer]2016 Elsevier Inc.

## 2015-03-17 NOTE — ED Provider Notes (Signed)
CSN: [id number]     Arrival date & time 03/17/15  1027 History   First MD Initiated Contact with Patient 03/17/15 1305    Nurses notes were reviewed. Chief Complaint  Patient presents with  . Ingrown Toenail   patient reports swelling of the right big toe. That has been going on for a couple weeks. He has tried to do get out unsuccessfully. Should be noted that patient had a left ingrown toenail several months ago/year ago and we had to remove the left toenail. During that time he had a lot of discomfort at the nail was removed but does report now that the nail feels great. (Consider location/radiation/quality/duration/timing/severity/associated sxs/prior Treatment) HPI  History reviewed. No pertinent past medical history. Past Surgical History  Procedure Laterality Date  . Vasectomy Bilateral 10/12/14  . Hernia repair    . Parathyroidectomy     History reviewed. No pertinent family history. Social History  Substance Use Topics  . Smoking status: Never Smoker   . Smokeless tobacco: Never Used  . Alcohol Use: No    Review of Systems  All other systems reviewed and are negative.   Allergies  Diphenhydramine and Doxycycline  Home Medications   Prior to Admission medications   Medication Sig Start Date End Date Taking? Authorizing Provider  cefUROXime (CEFTIN) 500 MG tablet Take 1 tablet (500 mg total) by mouth 2 (two) times daily. 03/17/15   Hassan Rowan, MD  HYDROcodone-acetaminophen (NORCO) 5-325 MG per tablet Take 1 tablet by mouth every 8 (eight) hours as needed for moderate pain. 02/07/15   Jenise V Bacon Menshew, PA-C  ibuprofen (ADVIL,MOTRIN) 800 MG tablet Take 800 mg by mouth every 8 (eight) hours as needed for mild pain.     Historical Provider, MD  ketorolac (TORADOL) 10 MG tablet Take 1 tablet (10 mg total) by mouth every 8 (eight) hours. 02/07/15   Jenise V Bacon Menshew, PA-C  meloxicam (MOBIC) 15 MG tablet Take 1 tablet (15 mg total) by mouth daily. 03/17/15   Hassan Rowan, MD  orphenadrine (NORFLEX) 100 MG tablet Take 1 tablet (100 mg total) by mouth 2 (two) times daily as needed for muscle spasms. 02/07/15   Jenise V Bacon Menshew, PA-C  traMADol (ULTRAM) 50 MG tablet Take 1 tablet (50 mg total) by mouth every 6 (six) hours as needed for severe pain. May cause sedation 03/17/15   Hassan Rowan, MD   Meds Ordered and Administered this Visit  Medications - No data to display  BP 111/80 mmHg  Pulse 58  Temp(Src) 97.7 F (36.5 C) (Oral)  Ht  (1.803 m)  Wt 208 lb (94.348 kg)  BMI 29.02 kg/m2  SpO2 100% No data found.   Physical Exam  Constitutional: He is oriented to person, place, and time. He appears well-developed and well-nourished.  HENT:  Head: Normocephalic.  Musculoskeletal: He exhibits edema and tenderness.       Right foot: There is tenderness.       Feet:  Right toe ingrown on the medial aspect  Neurological: He is alert and oriented to person, place, and time.  Skin: Skin is warm. No rash noted. No erythema.  Psychiatric: He has a normal mood and affect. His behavior is normal.  Vitals reviewed.   ED Course  Procedures (including critical care time)  Labs Review Labs Reviewed - No data to display  Imaging Review No results found.   Visual Acuity Review  Right Eye Distance:   Left Eye Distance:  Bilateral Distance:    Right Eye Near:   Left Eye Near:    Bilateral Near:         MDM   1. Ingrown right big toenail     Prior discussion I had with him over year ago I will be glad to move that toenail if antibiotics does not work and he tried soaking it. We'll given something for pain for now and antibiotics Ceftin 500 mg twice a day. Tome on here in 2 weeks in a [age]-year-old male Thursday as well as other days. If he was come back in 2 weeks had the nail removed by but otherwise will going give this some time to see if the nail will grow out an absolutely no trimming of the nail on the medial border anymore.  Work note given as well.  Hassan RowanEugene Demarion Pondexter, MD 03/17/15 1332

## 2015-03-27 ENCOUNTER — Ambulatory Visit
Admission: EM | Admit: 2015-03-27 | Discharge: 2015-03-27 | Disposition: A | Discharge status: Home / Self Care | Payer: BLUE CROSS/BLUE SHIELD | Attending: Family Medicine | Admitting: Family Medicine

## 2015-03-27 ENCOUNTER — Encounter: Payer: Self-pay | Admitting: Emergency Medicine

## 2015-03-27 DIAGNOSIS — J069 Acute upper respiratory infection, unspecified: Secondary | ICD-10-CM

## 2015-03-27 LAB — RAPID STREP SCREEN (MED CTR MEBANE ONLY): Streptococcus, Group A Screen (Direct): NEGATIVE

## 2015-03-27 MED ORDER — HYDROCOD POLST-CPM POLST ER 10-8 MG/5ML PO SUER: 5.0000 mL | Freq: Two times a day (BID) | ORAL | Status: DC

## 2015-03-27 MED ORDER — BENZONATATE 100 MG PO CAPS: 200.0000 mg | ORAL_CAPSULE | Freq: Three times a day (TID) | ORAL | Status: DC

## 2015-03-27 NOTE — ED Notes (Signed)
Patient c/o sore throat, stuffy nose, HAs, and cough since yesterday.

## 2015-03-27 NOTE — Discharge Instructions (Signed)
Cool Mist Vaporizers °Vaporizers may help relieve the symptoms of a cough and cold. They add moisture to the air, which helps mucus to become thinner and less sticky. This makes it easier to breathe and cough up secretions. Cool mist vaporizers do not cause serious burns like hot mist vaporizers, which may also be called steamers or humidifiers. Vaporizers have not been proven to help with colds. You should not use a vaporizer if you are allergic to mold. °HOME CARE INSTRUCTIONS °· Follow the package instructions for the vaporizer. °· Do not use anything other than distilled water in the vaporizer. °· Do not run the vaporizer all of the time. This can cause mold or bacteria to grow in the vaporizer. °· Clean the vaporizer after each time it is used. °· Clean and dry the vaporizer well before storing it. °· Stop using the vaporizer if worsening respiratory symptoms develop. °  °This information is not intended to replace advice given to you by your health care provider. Make sure you discuss any questions you have with your health care provider. °  °Document Released: 01/31/2004 Document Revised: 05/10/2013 Document Reviewed: 09/22/2012 °Elsevier Interactive Patient Education ©2016 Elsevier Inc. ° °Upper Respiratory Infection, Adult °Most upper respiratory infections (URIs) are a viral infection of the air passages leading to the lungs. A URI affects the nose, throat, and upper air passages. The most common type of URI is nasopharyngitis and is typically referred to as "the common cold." °URIs run their course and usually go away on their own. Most of the time, a URI does not require medical attention, but sometimes a bacterial infection in the upper airways can follow a viral infection. This is called a secondary infection. Sinus and middle ear infections are common types of secondary upper respiratory infections. °Bacterial pneumonia can also complicate a URI. A URI can worsen asthma and chronic obstructive  pulmonary disease (COPD). Sometimes, these complications can require emergency medical care and may be life threatening.  °CAUSES °Almost all URIs are caused by viruses. A virus is a type of germ and can spread from one person to another.  °RISKS FACTORS °You may be at risk for a URI if:  °· You smoke.   °· You have chronic heart or lung disease. °· You have a weakened defense (immune) system.   °· You are very young or very old.   °· You have nasal allergies or asthma. °· You work in crowded or poorly ventilated areas. °· You work in health care facilities or schools. °SIGNS AND SYMPTOMS  °Symptoms typically develop 2-3 days after you come in contact with a cold virus. Most viral URIs last 7-10 days. However, viral URIs from the influenza virus (flu virus) can last 14-18 days and are typically more severe. Symptoms may include:  °· Runny or stuffy (congested) nose.   °· Sneezing.   °· Cough.   °· Sore throat.   °· Headache.   °· Fatigue.   °· Fever.   °· Loss of appetite.   °· Pain in your forehead, behind your eyes, and over your cheekbones (sinus pain). °· Muscle aches.   °DIAGNOSIS  °Your health care provider may diagnose a URI by: °· Physical exam. °· Tests to check that your symptoms are not due to another condition such as: °¨ Strep throat. °¨ Sinusitis. °¨ Pneumonia. °¨ Asthma. °TREATMENT  °A URI goes away on its own with time. It cannot be cured with medicines, but medicines may be prescribed or recommended to relieve symptoms. Medicines may help: °· Reduce your fever. °· Reduce   your cough. °· Relieve nasal congestion. °HOME CARE INSTRUCTIONS  °· Take medicines only as directed by your health care provider.   °· Gargle warm saltwater or take cough drops to comfort your throat as directed by your health care provider. °· Use a warm mist humidifier or inhale steam from a shower to increase air moisture. This may make it easier to breathe. °· Drink enough fluid to keep your urine clear or pale yellow.   °· Eat  soups and other clear broths and maintain good nutrition.   °· Rest as needed.   °· Return to work when your temperature has returned to normal or as your health care provider advises. You may need to stay home longer to avoid infecting others. You can also use a face mask and careful hand washing to prevent spread of the virus. °· Increase the usage of your inhaler if you have asthma.   °· Do not use any tobacco products, including cigarettes, chewing tobacco, or electronic cigarettes. If you need help quitting, ask your health care provider. °PREVENTION  °The best way to protect yourself from getting a cold is to practice good hygiene.  °· Avoid oral or hand contact with people with cold symptoms.   °· Wash your hands often if contact occurs.   °There is no clear evidence that vitamin C, vitamin E, echinacea, or exercise reduces the chance of developing a cold. However, it is always recommended to get plenty of rest, exercise, and practice good nutrition.  °SEEK MEDICAL CARE IF:  °· You are getting worse rather than better.   °· Your symptoms are not controlled by medicine.   °· You have chills. °· You have worsening shortness of breath. °· You have brown or red mucus. °· You have yellow or brown nasal discharge. °· You have pain in your face, especially when you bend forward. °· You have a fever. °· You have swollen neck glands. °· You have pain while swallowing. °· You have white areas in the back of your throat. °SEEK IMMEDIATE MEDICAL CARE IF:  °· You have severe or persistent: °¨ Headache. °¨ Ear pain. °¨ Sinus pain. °¨ Chest pain. °· You have chronic lung disease and any of the following: °¨ Wheezing. °¨ Prolonged cough. °¨ Coughing up blood. °¨ A change in your usual mucus. °· You have a stiff neck. °· You have changes in your: °¨ Vision. °¨ Hearing. °¨ Thinking. °¨ Mood. °MAKE SURE YOU:  °· Understand these instructions. °· Will watch your condition. °· Will get help right away if you are not doing well or  get worse. °  °This information is not intended to replace advice given to you by your health care provider. Make sure you discuss any questions you have with your health care provider. °  °Document Released: 10/29/2000 Document Revised: 09/19/2014 Document Reviewed: 08/10/2013 °Elsevier Interactive Patient Education ©2016 Elsevier Inc. ° °

## 2015-03-27 NOTE — ED Provider Notes (Signed)
CSN: [id number]     Arrival date & time 03/27/15  4098 History   First MD Initiated Contact with Patient 03/27/15 0830     Chief Complaint  Patient presents with  . Sore Throat  . Cough   (Consider location/radiation/quality/duration/timing/severity/associated sxs/prior Treatment) HPI     34 year old male who presents with a very sore throat is having a hard time swallowing with upper respiratory symptoms leading headache stuffy nose chills and a fever to 100.3 yesterday. He is afebrile today. His cough has been largely nonproductive at nighttime and he lies down. He was on Ceftin and he stopped taking it before he completed his course.    History reviewed. No pertinent past medical history. Past Surgical History  Procedure Laterality Date  . Vasectomy Bilateral 10/12/14  . Hernia repair    . Parathyroidectomy     History reviewed. No pertinent family history. Social History  Substance Use Topics  . Smoking status: Never Smoker   . Smokeless tobacco: Never Used  . Alcohol Use: No    Review of Systems  Constitutional: Positive for fever, chills and fatigue.  HENT: Positive for congestion, drooling, postnasal drip, rhinorrhea, sinus pressure, sneezing and sore throat. Negative for ear pain.   Respiratory: Positive for cough. Negative for shortness of breath.   All other systems reviewed and are negative.   Allergies  Diphenhydramine and Doxycycline  Home Medications   Prior to Admission medications   Medication Sig Start Date End Date Taking? Authorizing Provider  benzonatate (TESSALON) 100 MG capsule Take 2 capsules (200 mg total) by mouth every 8 (eight) hours. 03/27/15   Lutricia Feil, PA-C  cefUROXime (CEFTIN) 500 MG tablet Take 1 tablet (500 mg total) by mouth 2 (two) times daily. 03/17/15   Hassan Rowan, MD  chlorpheniramine-HYDROcodone [facility] PENNKINETIC ER) 10-8 MG/5ML SUER Take 5 mLs by mouth 2 (two) times daily. 03/27/15   Lutricia Feil, PA-C   HYDROcodone-acetaminophen (NORCO) 5-325 MG per tablet Take 1 tablet by mouth every 8 (eight) hours as needed for moderate pain. 02/07/15   Jenise V Bacon Menshew, PA-C  ibuprofen (ADVIL,MOTRIN) 800 MG tablet Take 800 mg by mouth every 8 (eight) hours as needed for mild pain.     Historical Provider, MD  ketorolac (TORADOL) 10 MG tablet Take 1 tablet (10 mg total) by mouth every 8 (eight) hours. 02/07/15   Jenise V Bacon Menshew, PA-C  meloxicam (MOBIC) 15 MG tablet Take 1 tablet (15 mg total) by mouth daily. 03/17/15   Hassan Rowan, MD  orphenadrine (NORFLEX) 100 MG tablet Take 1 tablet (100 mg total) by mouth 2 (two) times daily as needed for muscle spasms. 02/07/15   Jenise V Bacon Menshew, PA-C  traMADol (ULTRAM) 50 MG tablet Take 1 tablet (50 mg total) by mouth every 6 (six) hours as needed for severe pain. May cause sedation 03/17/15   Hassan Rowan, MD   Meds Ordered and Administered this Visit  Medications - No data to display  BP 122/75 mmHg  Pulse 87  Temp(Src) 98.5 F (36.9 C) (Tympanic)  Resp 16  Ht 5' 9.5" (1.765 m)  Wt 178 lb (80.74 kg)  BMI 25.92 kg/m2  SpO2 100% No data found.   Physical Exam  Constitutional: He is oriented to person, place, and time. He appears well-developed and well-nourished. No distress.  HENT:  Head: Normocephalic and atraumatic.  Right Ear: External ear normal.  Left Ear: External ear normal.  Nose: Nose normal.  Mouth/Throat: Oropharynx is clear  and moist. No oropharyngeal exudate.  Eyes: Pupils are equal, round, and reactive to light.  Neck: Neck supple.  Pulmonary/Chest: Effort normal and breath sounds normal. No respiratory distress. He has no wheezes. He has no rales.  Musculoskeletal: Normal range of motion.  Lymphadenopathy:    He has no cervical adenopathy.  Neurological: He is alert and oriented to person, place, and time.  Skin: Skin is warm and dry. He is not diaphoretic.  Psychiatric: He has a normal mood and affect. His behavior is  normal. Judgment and thought content normal.  Nursing note and vitals reviewed.   ED Course  Procedures (including critical care time)  Labs Review Labs Reviewed  R[facility] STRE[facility]CREEN (NOT [facility])  CULTURE, GROUP A STREP ([facility] ONLY)    Imaging Review No results found.   Visual Acuity Review  Right Eye Distance:   Left Eye Distance:   Bilateral Distance:    Right Eye Near:   Left Eye Near:    Bilateral Near:         MDM   1. URI, acute    Discharge Medication List as of 03/27/2015  8:59 AM    START taking these medications   Details  benzonatate (TESSALON) 100 MG capsule Take 2 capsules (200 mg total) by mouth every 8 (eight) hours., Starting 03/27/2015, Until Discontinued, Print    chlorpheniramine-HYDROcodone (TUSSIONEX PENNKINETIC ER) 10-8 MG/5ML SUER Take 5 mLs by mouth 2 (two) times daily., Starting 03/27/2015, Until Discontinued, Print      Plan: 1. Test/x-ray results and diagnosis reviewed with patient 2. rx as per orders; risks, benefits, potential side effects reviewed with patient 3. Recommend supportive treatment with rest fluids. Try Zyrtec-D. OOW today and tomorrow.  4. F/u prn if symptoms worsen or don't improve     Lutricia FeilWilliam P Ebon Ketchum, PA-C 03/27/15 0915

## 2015-03-29 LAB — CULTURE, GROUP A STREP (THRC)

## 2015-03-29 NOTE — ED Notes (Signed)
Final report of strep testing negative  

## 2015-04-01 ENCOUNTER — Ambulatory Visit
Admission: EM | Admit: 2015-04-01 | Discharge: 2015-04-01 | Disposition: A | Discharge status: Home / Self Care | Payer: BLUE CROSS/BLUE SHIELD | Attending: Family Medicine | Admitting: Family Medicine

## 2015-04-01 ENCOUNTER — Encounter: Payer: Self-pay | Admitting: Gynecology

## 2015-04-01 DIAGNOSIS — L600 Ingrowing nail: Secondary | ICD-10-CM

## 2015-04-01 MED ORDER — OXYCODONE-ACETAMINOPHEN 7.5-325 MG PO TABS
1.0000 | ORAL_TABLET | ORAL | Status: DC | PRN
Start: 2015-04-01 — End: 2015-08-03

## 2015-04-01 MED ORDER — MUPIROCIN 2 % EX OINT: 1.0000 "application " | TOPICAL_OINTMENT | Freq: Three times a day (TID) | CUTANEOUS | Status: DC

## 2015-04-01 MED ORDER — CEFUROXIME AXETIL 500 MG PO TABS
500.0000 mg | ORAL_TABLET | Freq: Two times a day (BID) | ORAL | Status: DC
Start: 2015-04-01 — End: 2015-11-23

## 2015-04-01 NOTE — ED Notes (Signed)
Patient c/o right big toe ingrown toenail. Per patient was seen x week ago and was told to let the toenail grew out. Pt. Now stated swollen / bleeding and painful.

## 2015-04-01 NOTE — ED Provider Notes (Signed)
CSN: [id number]     Arrival date & time 04/01/15  2130 History   First MD Initiated Contact with Patient 04/01/15 1033    Nurses notes were reviewed. Chief Complaint  Patient presents with  . Nail Problem   patient is here because of ingrown toenail. We saw him a few weeks ago and he failed antibiotic treatment. States nails gotten worse more swollen and more inflamed especially walks. Effient and rubs against it. He had a left toenail removed so he is at this point ready to have the right toenail removed to finally get some relief.   (Consider location/radiation/quality/duration/timing/severity/associated sxs/prior Treatment) Patient is a 34 y.o. male presenting with toe pain. The history is provided by the patient.  Toe Pain This is a new problem. The current episode started more than 1 week ago. The problem occurs constantly. The problem has been gradually worsening. Pertinent negatives include no chest pain, no abdominal pain, no headaches and no shortness of breath. The symptoms are aggravated by walking. Nothing relieves the symptoms. Treatments tried: And box tried earlier.    History reviewed. No pertinent past medical history. Past Surgical History  Procedure Laterality Date  . Vasectomy Bilateral 10/12/14  . Hernia repair    . Parathyroidectomy     No family history on file. Social History  Substance Use Topics  . Smoking status: Never Smoker   . Smokeless tobacco: Never Used  . Alcohol Use: No    Review of Systems  Respiratory: Negative for shortness of breath.   Cardiovascular: Negative for chest pain.  Gastrointestinal: Negative for abdominal pain.  Neurological: Negative for headaches.  All other systems reviewed and are negative.   Allergies  Diphenhydramine and Doxycycline  Home Medications   Prior to Admission medications   Medication Sig Start Date End Date Taking? Authorizing Provider  benzonatate (TESSALON) 100 MG capsule Take 2 capsules (200 mg total)  by mouth every 8 (eight) hours. 03/27/15  Yes Lutricia Feil, PA-C  cefUROXime (CEFTIN) 500 MG tablet Take 1 tablet (500 mg total) by mouth 2 (two) times daily. 03/17/15  Yes Hassan Rowan, MD  chlorpheniramine-HYDROcodone [facility] PENNKINETIC ER) 10-8 MG/5ML SUER Take 5 mLs by mouth 2 (two) times daily. 03/27/15  Yes Lutricia Feil, PA-C  HYDROcodone-acetaminophen (NORCO) 5-325 MG per tablet Take 1 tablet by mouth every 8 (eight) hours as needed for moderate pain. 02/07/15  Yes Jenise V Bacon Menshew, PA-C  ibuprofen (ADVIL,MOTRIN) 800 MG tablet Take 800 mg by mouth every 8 (eight) hours as needed for mild pain.    Yes Historical Provider, MD  ketorolac (TORADOL) 10 MG tablet Take 1 tablet (10 mg total) by mouth every 8 (eight) hours. 02/07/15  Yes Jenise V Bacon Menshew, PA-C  meloxicam (MOBIC) 15 MG tablet Take 1 tablet (15 mg total) by mouth daily. 03/17/15  Yes Hassan Rowan, MD  orphenadrine (NORFLEX) 100 MG tablet Take 1 tablet (100 mg total) by mouth 2 (two) times daily as needed for muscle spasms. 02/07/15  Yes Jenise V Bacon Menshew, PA-C  traMADol (ULTRAM) 50 MG tablet Take 1 tablet (50 mg total) by mouth every 6 (six) hours as needed for severe pain. May cause sedation 03/17/15  Yes Hassan Rowan, MD   Meds Ordered and Administered this Visit  Medications - No data to display  BP 116/74 mmHg  Pulse 65  Temp(Src) 97.9 F (36.6 C) (Tympanic)  Resp [id number]>  (1.803 m)  Wt 204 lb (92.534 kg)  BMI  28.46 kg/m2  SpO2 100% No data found.   Physical Exam  Constitutional: He is oriented to person, place, and time. He appears well-developed and well-nourished.  HENT:  Head: Normocephalic.  Eyes: Pupils are equal, round, and reactive to light.  Musculoskeletal: He exhibits tenderness.  Patient's right toenail markedly ingrown on the lateral border. On the inferior lateral border there is pus draining from the site of the ingrown toenail.  Neurological: He is alert and oriented to  person, place, and time.  Skin: Skin is warm and dry.  Psychiatric: He has a normal mood and affect. His behavior is normal.  Vitals reviewed.   ED Course  .Nail Removal Date/Time: 04/01/2015 11:51 AM Performed by: Hassan RowanWADE, Shealyn Sean Authorized by: Hassan RowanWADE, Sybil Shrader Consent: Verbal consent obtained. Written consent not obtained. Risks and benefits: risks, benefits and alternatives were discussed Consent given by: patient Patient understanding: patient states understanding of the procedure being performed Location: right foot Anesthesia: local infiltration and digital block Local anesthetic: lidocaine 1% without epinephrine Patient sedated: no Preparation: skin prepped with Betadine Amount removed: complete Wedge excision of skin of nail fold: no Nail bed sutured: no Nail matrix removed: none Dressing: 4x4, antibiotic ointment and gauze roll Patient tolerance: Patient tolerated the procedure well with no immediate complications Comments: Patient tolerated nail removal quite well. Tourniquet will be removed before leaving and dressing 0.4 hours pain medication also be given as well.   (including critical care time)  Labs Review Labs Reviewed - No data to display  Imaging Review No results found.   Visual Acuity Review  Right Eye Distance:   Left Eye Distance:   Bilateral Distance:    Right Eye Near:   Left Eye Near:    Bilateral Near:         MDM  No diagnosis found.  We'll give patient. Percocet 09/19/2023 21 by mouth every 6 hours hours when necessary basis will renew Ceftin prescription 500 mg twice day and Bactroban ointment to apply 2-3 times a day over the big toenail as well. Patient 5 second nail removed so she returned if needed for reevaluation work note for Monday and Tuesday and then go back to work on Wednesday.   Hassan RowanEugene Trinna Kunst, MD 04/01/15 [phone]

## 2015-04-01 NOTE — Discharge Instructions (Signed)
Fingernail or Toenail Removal Fingernail or toenail removal is a surgical procedure to take off a nail from your finger or your toe. You may need to have a fingernail or toenail removed if it has an abnormal shape (deformity) or if it is severely injured. A fingernail or toenail may also be removed due to a bacterial infection, a severe ingrown toenail, or a fungal infection that has failed treatment with antifungal medicines. LET [facility] CARE PROVIDER KNOW ABOUT:  Any allergies you have.  All medicines you are taking, including vitamins, herbs, eye drops, creams, and over-the-counter medicines.  Previous problems you or members of your family have had with the use of anesthetics.  Any blood disorders you have.  Previous surgeries you have had.  Any medical conditions you may have. RISKS AND COMPLICATIONS Generally, this is a safe procedure. However, problems may occur, including:  Pain.  Bleeding.  Infection.  Regrowth of a deformed nail. BEFORE THE PROCEDURE  Ask your health care provider about changing or stopping your regular medicines. This is especially important if you are taking diabetes medicines or blood thinners.  Follow instructions from your health care provider about eating or drinking restrictions.  Plan to have someone take you home after the procedure. PROCEDURE  An IV tube will be inserted into one of your veins.  You will be given one or more of the following:  A medicine that helps you relax (sedative).  A medicine that numbs the area (local anesthetic).  After your toe or finger is numb, your health care provider will insert a blunt instrument under your nail to lift it up.  In some cases, your health care provider may also make a cut (incision) in your nail.  After your nail is lifted away from your toe or finger, your health care provider will detach it from your nail bed.  A germ-killing bandage (antiseptic dressing) will be put on your toe  or finger. The procedure may vary among health care providers and hospitals. AFTER THE PROCEDURE  Your blood pressure, heart rate, breathing rate, and blood oxygen level will be monitored often until the medicines you were given have worn off.  It is common to have some pain after nail removal. You will be given pain medicine as needed.  You may be given a prescription for pain medicine and antibiotic medicine.  If you had a toenail removed, you will be given a surgical shoe to wear while you recover.  If you had a fingernail removed, you may be given a finger splint to wear while you recover.   This information is not intended to replace advice given to you by your health care provider. Make sure you discuss any questions you have with your health care provider.   Document Released: 02/01/2003 Document Revised: 09/19/2014 Document Reviewed: 05/03/2014 Elsevier Interactive Patient Education 2016 [employer]  Fingernail or Toenail Removal, Care After Refer to this sheet in the next few weeks. These instructions provide you with information about caring for yourself after your procedure. Your health care provider may also give you more specific instructions. Your treatment has been planned according to current medical practices, but problems sometimes occur. Call your health care provider if you have any problems or questions after your procedure. WHAT TO EXPECT AFTER THE PROCEDURE After your procedure, it is common to have:  Redness.  Swelling. HOME CARE INSTRUCTIONS  If you have a splint on your finger:  Wear it as directed by your health care  provider. Remove it only as directed by your health care provider.  Loosen the splint if your fingers become numb and tingle, or if they turn cold and blue.  If you were given a surgical shoe, wear it as directed by your health care provider.  Take medicines only as directed by your health care provider.  Elevate your hand or foot as  much of the time as possible. This helps with pain and swelling.  If you are recovering from fingernail removal, keep your hand raised above the level of your heart.  If you are recovering from toenail removal, lie on a bed or a couch with your leg propped up on pillows, or sit in a reclining chair with the footrest up.  Follow instructions from your health care provider about bandage (dressing) changes and removal:  Change your dressing 24 hours after your procedure or as directed by your health care provider.  Soak your hand or foot in warm, soapy water for 10-20 minutes or as directed by your health care provider. Do this 3 times per day or as directed by your health care provider. This reduces pain and swelling.  After you soak your hand or foot, apply a clean, dry dressing.  Keep your dressing clean and dry. Change your dressing whenever it gets wet or dirty.  Keep all follow-up visits as directed by your health care provider. This is important. SEEK MEDICAL CARE IF:  You have increased redness or pain at your nail area.  You have increased fluid, blood, or pus coming from your nail area.  There is a bad smell coming from the dressing.  You have a fever.  Your swelling gets worse, or you have swelling that spreads from your finger to your hand or from your toe to your foot.  You have worsening redness that spreads from your finger to your hand or from your toe up to your foot.  Your finger or toe looks blue or black.   This information is not intended to replace advice given to you by your health care provider. Make sure you discuss any questions you have with your health care provider.   Document Released: 05/26/2014 Document Reviewed: 05/26/2014 Elsevier Interactive Patient Education 2016 [employer]  Ingrown Toenail An ingrown toenail occurs when the corner or sides of your toenail grow into the surrounding skin. The big toe is most commonly affected, but it can  happen to any of your toes. If your ingrown toenail is not treated, you will be at risk for infection. CAUSES This condition may be caused by:  Wearing shoes that are too small or tight.  Injury or trauma, such as stubbing your toe or having your toe stepped on.  Improper cutting or care of your toenails.  Being born with (congenital) nail or foot abnormalities, such as having a nail that is too big for your toe. RISK FACTORS Risk factors for an ingrown toenail include:  Age. Your nails tend to thicken as you get older, so ingrown nails are more common in older people.  Diabetes.  Cutting your toenails incorrectly.  Blood circulation problems. SYMPTOMS Symptoms may include:  Pain, soreness, or tenderness.  Redness.  Swelling.  Hardening of the skin surrounding the toe. Your ingrown toenail may be infected if there is fluid, pus, or drainage. DIAGNOSIS  An ingrown toenail may be diagnosed by medical history and physical exam. If your toenail is infected, your health care provider may test a sample of the drainage.  TREATMENT Treatment depends on the severity of your ingrown toenail. Some ingrown toenails may be treated at home. More severe or infected ingrown toenails may require surgery to remove all or part of the nail. Infected ingrown toenails may also be treated with antibiotic medicines. HOME CARE INSTRUCTIONS  If you were prescribed an antibiotic medicine, finish all of it even if you start to feel better.  Soak your foot in warm soapy water for 20 minutes, 3 times per day or as directed by your health care provider.  Carefully lift the edge of the nail away from the sore skin by wedging a small piece of cotton under the corner of the nail. This may help with the pain. Be careful not to cause more injury to the area.  Wear shoes that fit well. If your ingrown toenail is causing you pain, try wearing sandals, if possible.  Trim your toenails regularly and carefully.  Do not cut them in a curved shape. Cut your toenails straight across. This prevents injury to the skin at the corners of the toenail.  Keep your feet clean and dry.  If you are having trouble walking and are given crutches by your health care provider, use them as directed.  Do not pick at your toenail or try to remove it yourself.  Take medicines only as directed by your health care provider.  Keep all follow-up visits as directed by your health care provider. This is important. SEEK MEDICAL CARE IF:  Your symptoms do not improve with treatment. SEEK IMMEDIATE MEDICAL CARE IF:  You have red streaks that start at your foot and go up your leg.  You have a fever.  You have increased redness, swelling, or pain.  You have fluid, blood, or pus coming from your toenail.   This information is not intended to replace advice given to you by your health care provider. Make sure you discuss any questions you have with your health care provider.   Document Released: 05/02/2000 Document Revised: 09/19/2014 Document Reviewed: 03/29/2014 Elsevier Interactive Patient Education [employer]

## 2015-06-18 ENCOUNTER — Telehealth: Payer: Self-pay | Admitting: Surgery

## 2015-06-18 NOTE — Telephone Encounter (Signed)
Spoke with patient at this time. He explains that he has an area on his right side that cane up about a week ago and is red but looks a little better to patient this week. This is no where near any of his incisions, per patient. He states that it looks like "a hair bump." Encouraged patient to call PCP and have them check this out and we would be glad to see him if we needed to but to see PCP first due to his insurance needing a referral to surgery.

## 2015-06-18 NOTE — Telephone Encounter (Signed)
Please call, patient of Dr Excell Seltzer (hernia surgery February 2015) He now has a hard knot on the left side of his abdomen that continued to grow and get larger over time. It is painful to the touch. Unsure if related to the hernia?

## 2015-06-23 ENCOUNTER — Ambulatory Visit
Admission: EM | Admit: 2015-06-23 | Discharge: 2015-06-23 | Disposition: A | Discharge status: Home / Self Care | Payer: BLUE CROSS/BLUE SHIELD | Attending: Family Medicine | Admitting: Family Medicine

## 2015-06-23 ENCOUNTER — Encounter: Payer: Self-pay | Admitting: Gynecology

## 2015-06-23 DIAGNOSIS — R1900 Intra-abdominal and pelvic swelling, mass and lump, unspecified site: Secondary | ICD-10-CM | POA: Diagnosis not present

## 2015-06-23 DIAGNOSIS — L0291 Cutaneous abscess, unspecified: Secondary | ICD-10-CM

## 2015-06-23 MED ORDER — SULFAMETHOXAZOLE-TRIMETHOPRIM 800-160 MG PO TABS: 1.0000 | ORAL_TABLET | Freq: Two times a day (BID) | ORAL | Status: AC

## 2015-06-23 NOTE — ED Provider Notes (Addendum)
CSN: [id number]     Arrival date & time 06/23/15  0914 History   First MD Initiated Contact with Patient 06/23/15 1010     Chief Complaint  Patient presents with  . Mass   (Consider location/radiation/quality/duration/timing/severity/associated sxs/prior Treatment) HPI: Patient presents today with a mass felt on the left side of his abdomen for the last 3 weeks. Patient states initially it was some redness of the skin around the area. Patient stated that he initially thought it was just a hair follicle that got infected. The area he feels has gotten slightly larger and is tender to touch. He denies any other lesions similar to this anywhere else. He denies any problems with diarrhea or constipation. He denies any fever. He denies any drainage from the site. He denies any heavy lifting or doing anything strenuous prior to the symptoms developing. She denies any history of abdominal hernia.  History reviewed. No pertinent past medical history. Past Surgical History  Procedure Laterality Date  . Vasectomy Bilateral 10/12/14  . Hernia repair    . Parathyroidectomy     No family history on file. Social History  Substance Use Topics  . Smoking status: Never Smoker   . Smokeless tobacco: Never Used  . Alcohol Use: No    Review of Systems: Negative except mentioned above.   Allergies  Diphenhydramine and Doxycycline  Home Medications   Prior to Admission medications   Medication Sig Start Date End Date Taking? Authorizing Provider  benzonatate (TESSALON) 100 MG capsule Take 2 capsules (200 mg total) by mouth every 8 (eight) hours. 03/27/15   Lutricia Feil, PA-C  cefUROXime (CEFTIN) 500 MG tablet Take 1 tablet (500 mg total) by mouth 2 (two) times daily. 03/17/15   Hassan Rowan, MD  cefUROXime (CEFTIN) 500 MG tablet Take 1 tablet (500 mg total) by mouth 2 (two) times daily. 04/01/15   Hassan Rowan, MD  chlorpheniramine-HYDROcodone [facility] PENNKINETIC ER) 10-8 MG/5ML SUER Take 5 mLs by  mouth 2 (two) times daily. 03/27/15   Lutricia Feil, PA-C  HYDROcodone-acetaminophen (NORCO) 5-325 MG per tablet Take 1 tablet by mouth every 8 (eight) hours as needed for moderate pain. 02/07/15   Jenise V Bacon Menshew, PA-C  ibuprofen (ADVIL,MOTRIN) 800 MG tablet Take 800 mg by mouth every 8 (eight) hours as needed for mild pain.     Historical Provider, MD  ketorolac (TORADOL) 10 MG tablet Take 1 tablet (10 mg total) by mouth every 8 (eight) hours. 02/07/15   Jenise V Bacon Menshew, PA-C  meloxicam (MOBIC) 15 MG tablet Take 1 tablet (15 mg total) by mouth daily. 03/17/15   Hassan Rowan, MD  mupirocin ointment (BACTROBAN) 2 % Apply 1 application topically 3 (three) times daily. 04/01/15   Hassan Rowan, MD  orphenadrine (NORFLEX) 100 MG tablet Take 1 tablet (100 mg total) by mouth 2 (two) times daily as needed for muscle spasms. 02/07/15   Jenise V Bacon Menshew, PA-C  oxyCODONE-acetaminophen (PERCOCET) 7.5-325 MG tablet Take 1 tablet by mouth every 4 (four) hours as needed for severe pain. 04/01/15   Hassan Rowan, MD  sulfamethoxazole-trimethoprim (BACTRIM DS,SEPTRA DS) 800-160 MG tablet Take 1 tablet by mouth 2 (two) times daily. 06/23/15 06/30/15  Jolene Provost, MD  traMADol (ULTRAM) 50 MG tablet Take 1 tablet (50 mg total) by mouth every 6 (six) hours as needed for severe pain. May cause sedation 03/17/15   Hassan Rowan, MD   Meds Ordered and Administered this Visit  Medications - No data to  display  BP 127/82 mmHg  Pulse 73  Temp(Src) 97.9 F (36.6 C) (Oral)  Resp 16  Ht  (1.803 m)  Wt 200 lb (90.719 kg)  BMI 27.91 kg/m2  SpO2 100% No data found.   Physical Exam   GENERAL: NAD HEENT: no pharyngeal erythema, no exudate RESP: CTA B CARD: RRR ABD: dime sized circular tender mass appreciated on the left side of abdomen, minimal erythema of skin over the area, no streaks, no drainage from the site, no inquinal LAD or hernia appreciated  NEURO: CN II-XII grossly intact   ED Course   Procedures (including critical care time)    MDM   1. Abscess   2. Mass of abdomen    I don't feel the area appears to be related to a hernia but I have asked the patient to monitor symptoms and if it acutely worsens to go to the ER. Will treat currently for cyst/abscess with Bactrim DS. I have advised the patient to establish with a primary care physician and discuss further imaging if it persists. Patient addresses understanding of this and states that he will do this.    Jolene Provost, MD 06/23/15 1100  Jolene Provost, MD 06/23/15 1100

## 2015-06-23 NOTE — ED Notes (Signed)
Patient c/o felt lump at left side of abdomen x 3 weeks. Per patient lump increase in size and painful.

## 2015-06-28 ENCOUNTER — Ambulatory Visit
Admission: EM | Admit: 2015-06-28 | Discharge: 2015-06-28 | Disposition: A | Discharge status: Home / Self Care | Payer: BLUE CROSS/BLUE SHIELD | Attending: Family Medicine | Admitting: Family Medicine

## 2015-06-28 DIAGNOSIS — R1900 Intra-abdominal and pelvic swelling, mass and lump, unspecified site: Secondary | ICD-10-CM | POA: Diagnosis not present

## 2015-06-28 NOTE — Discharge Instructions (Signed)
Go to Er if symptoms worsen as discussed. Call me on Wednesday morning if you can't get appointment with Surgery.

## 2015-06-28 NOTE — ED Provider Notes (Signed)
CSN: [id number]     Arrival date & time 06/28/15  1831 History   First MD Initiated Contact with Patient 06/28/15 1846     No chief complaint on file.  (Consider location/radiation/quality/duration/timing/severity/associated sxs/prior Treatment) HPI: Patient presents today for recheck of a mass on his left abdomen. He states that it is still painful to touch. He was put on a course of Bactrim DS with the thought that this could be a sebaceous cyst/abscess. Patient states there has been no drainage from the site and it has not gotten bigger. He is unsure whether there is any redness of the skin over the site. He denies any problems with bowel movements. He denies any fever.  No past medical history on file. Past Surgical History  Procedure Laterality Date  . Vasectomy Bilateral 10/12/14  . Hernia repair    . Parathyroidectomy     No family history on file. Social History  Substance Use Topics  . Smoking status: Never Smoker   . Smokeless tobacco: Never Used  . Alcohol Use: No    Review of Systems: Negative except mentioned above.  Allergies  Diphenhydramine and Doxycycline  Home Medications   Prior to Admission medications   Medication Sig Start Date End Date Taking? Authorizing Provider  benzonatate (TESSALON) 100 MG capsule Take 2 capsules (200 mg total) by mouth every 8 (eight) hours. 03/27/15   Lutricia Feil, PA-C  cefUROXime (CEFTIN) 500 MG tablet Take 1 tablet (500 mg total) by mouth 2 (two) times daily. 03/17/15   Hassan Rowan, MD  cefUROXime (CEFTIN) 500 MG tablet Take 1 tablet (500 mg total) by mouth 2 (two) times daily. 04/01/15   Hassan Rowan, MD  chlorpheniramine-HYDROcodone [facility] PENNKINETIC ER) 10-8 MG/5ML SUER Take 5 mLs by mouth 2 (two) times daily. 03/27/15   Lutricia Feil, PA-C  HYDROcodone-acetaminophen (NORCO) 5-325 MG per tablet Take 1 tablet by mouth every 8 (eight) hours as needed for moderate pain. 02/07/15   Jenise V Bacon Menshew, PA-C  ibuprofen  (ADVIL,MOTRIN) 800 MG tablet Take 800 mg by mouth every 8 (eight) hours as needed for mild pain.     Historical Provider, MD  ketorolac (TORADOL) 10 MG tablet Take 1 tablet (10 mg total) by mouth every 8 (eight) hours. 02/07/15   Jenise V Bacon Menshew, PA-C  meloxicam (MOBIC) 15 MG tablet Take 1 tablet (15 mg total) by mouth daily. 03/17/15   Hassan Rowan, MD  mupirocin ointment (BACTROBAN) 2 % Apply 1 application topically 3 (three) times daily. 04/01/15   Hassan Rowan, MD  orphenadrine (NORFLEX) 100 MG tablet Take 1 tablet (100 mg total) by mouth 2 (two) times daily as needed for muscle spasms. 02/07/15   Jenise V Bacon Menshew, PA-C  oxyCODONE-acetaminophen (PERCOCET) 7.5-325 MG tablet Take 1 tablet by mouth every 4 (four) hours as needed for severe pain. 04/01/15   Hassan Rowan, MD  sulfamethoxazole-trimethoprim (BACTRIM DS,SEPTRA DS) 800-160 MG tablet Take 1 tablet by mouth 2 (two) times daily. 06/23/15 06/30/15  Jolene Provost, MD  traMADol (ULTRAM) 50 MG tablet Take 1 tablet (50 mg total) by mouth every 6 (six) hours as needed for severe pain. May cause sedation 03/17/15   Hassan Rowan, MD   Meds Ordered and Administered this Visit  Medications - No data to display  BP 143/83 mmHg  Pulse 78  Temp(Src) 97.8 F (36.6 C) (Oral)  Resp 16  SpO2 98% No data found.   Physical Exam:    GENERAL: NAD SKIN: no changes  from previous exam in Epic. No drainage from site, no significant erythema of skin overlying area or streaks noted   ED Course  Procedures (including critical care time)  Labs Review Labs Reviewed - No data to display  Imaging Review No results found.   MDM   1. Abdominal mass   Would recommend evaluation by surgeon given patient's discomfort and persistence of the mass. Patient may need a CT or ultrasound to identify the mass further. He is not toxic appearing at this time and is afebrile. Unsure whether this area is a cyst, hernia, etc. Patient is to make an appointment  with a general surgeon as soon as possible. If his symptoms worsen he is to go to the ER as discussed. If he has having trouble with getting an appointment with a general surgeon he can call me on Wednesday when I'm working here so that I can help get him an appointment. Patient appreciative.    Jolene Provost, MD 06/28/15 [phone]

## 2015-06-28 NOTE — ED Notes (Signed)
Knot/bump reported to left side of abdomen.  Painful to touch.  Redness noted to area.   Denies trauma to area.

## 2015-06-28 NOTE — ED Notes (Signed)
Non-toxic appearing pt.  Reporting small nodule/knot/bump to lower left abdominal area.  Redness noted.  Painful to palpation.  Reports has been increasing in size and pain over past couple of weeks.

## 2015-08-03 ENCOUNTER — Ambulatory Visit
Admission: EM | Admit: 2015-08-03 | Discharge: 2015-08-03 | Disposition: A | Discharge status: Home / Self Care | Payer: BLUE CROSS/BLUE SHIELD | Attending: Family Medicine | Admitting: Family Medicine

## 2015-08-03 DIAGNOSIS — M5432 Sciatica, left side: Secondary | ICD-10-CM

## 2015-08-03 MED ORDER — CYCLOBENZAPRINE HCL 10 MG PO TABS: 10.0000 mg | ORAL_TABLET | Freq: Every day | ORAL | Status: DC

## 2015-08-03 MED ORDER — HYDROCODONE-ACETAMINOPHEN 5-325 MG PO TABS: ORAL_TABLET | ORAL | Status: DC

## 2015-08-03 MED ORDER — PREDNISONE 20 MG PO TABS: 20.0000 mg | ORAL_TABLET | Freq: Every day | ORAL | Status: DC

## 2015-08-03 NOTE — ED Notes (Signed)
Patient complains of lower back pain with radiation down his leg. He states that he is having some numbness in the left leg as well. Patient states that he noticed his symptoms 2 days ago.

## 2015-08-03 NOTE — ED Provider Notes (Signed)
CSN: [id number]     Arrival date & time 08/03/15  1625 History   First MD Initiated Contact with Patient 08/03/15 1726     Chief Complaint  Patient presents with  . Back Pain   (Consider location/radiation/quality/duration/timing/severity/associated sxs/prior Treatment) HPI Comments: 35 yo male with a 2 day h/o left lower back pain radiating down the leg. Denies any trauma, injury, falls, fevers, chills, numbness/tingling, bowel or bladder problems.  The history is provided by the patient.    History reviewed. No pertinent past medical history. Past Surgical History  Procedure Laterality Date  . Vasectomy Bilateral 10/12/14  . Hernia repair    . Parathyroidectomy     Family History  Problem Relation Age of Onset  . Hypertension Father   . Cancer - Colon Mother   . Breast cancer Mother    Social History  Substance Use Topics  . Smoking status: Never Smoker   . Smokeless tobacco: Never Used     Comment: Vape  . Alcohol Use: No    Review of Systems  Allergies  Diphenhydramine and Doxycycline  Home Medications   Prior to Admission medications   Medication Sig Start Date End Date Taking? Authorizing Provider  benzonatate (TESSALON) 100 MG capsule Take 2 capsules (200 mg total) by mouth every 8 (eight) hours. 03/27/15   Lutricia FeilWilliam P Roemer, PA-C  cefUROXime (CEFTIN) 500 MG tablet Take 1 tablet (500 mg total) by mouth 2 (two) times daily. 03/17/15   Hassan RowanEugene Wade, MD  cefUROXime (CEFTIN) 500 MG tablet Take 1 tablet (500 mg total) by mouth 2 (two) times daily. 04/01/15   Hassan RowanEugene Wade, MD  cyclobenzaprine (FLEXERIL) 10 MG tablet Take 1 tablet (10 mg total) by mouth at bedtime. 08/03/15   Payton Mccallumrlando Ellarose Brandi, MD  HYDROcodone-acetaminophen (NORCO/VICODIN) 5-325 MG tablet 1-2 tabs po qd prn 08/03/15   Payton Mccallumrlando Jayliana Valencia, MD  ibuprofen (ADVIL,MOTRIN) 800 MG tablet Take 800 mg by mouth every 8 (eight) hours as needed for mild pain.     Historical Provider, MD  ketorolac (TORADOL) 10 MG tablet Take 1  tablet (10 mg total) by mouth every 8 (eight) hours. 02/07/15   Jenise V Bacon Menshew, PA-C  meloxicam (MOBIC) 15 MG tablet Take 1 tablet (15 mg total) by mouth daily. 03/17/15   Hassan RowanEugene Wade, MD  mupirocin ointment (BACTROBAN) 2 % Apply 1 application topically 3 (three) times daily. 04/01/15   Hassan RowanEugene Wade, MD  orphenadrine (NORFLEX) 100 MG tablet Take 1 tablet (100 mg total) by mouth 2 (two) times daily as needed for muscle spasms. 02/07/15   Jenise V Bacon Menshew, PA-C  predniSONE (DELTASONE) 20 MG tablet Take 1 tablet (20 mg total) by mouth daily. 08/03/15   Payton Mccallumrlando Ramiz Turpin, MD  traMADol (ULTRAM) 50 MG tablet Take 1 tablet (50 mg total) by mouth every 6 (six) hours as needed for severe pain. May cause sedation 03/17/15   Hassan RowanEugene Wade, MD   Meds Ordered and Administered this Visit  Medications - No data to display  BP 118/79 mmHg  Pulse 92  Temp(Src) 98.3 F (36.8 C) (Tympanic)  Resp 17  Ht 5\' 11"  (1.803 m)  Wt 206 lb (93.441 kg)  BMI 28.74 kg/m2  SpO2 97% No data found.   Physical Exam  Constitutional: He appears well-developed and well-nourished. No distress.  Neck: Normal range of motion. Neck supple. No tracheal deviation present.  Pulmonary/Chest: Effort normal. No stridor. No respiratory distress.  Musculoskeletal:       Cervical back: He exhibits normal range  of motion, no tenderness, no bony tenderness, no swelling, no edema, no deformity, no laceration, no pain, no spasm and normal pulse.       Lumbar back: He exhibits tenderness (over the left lumbar sacral paraspinous muscles and buttock) and spasm. He exhibits normal range of motion, no bony tenderness, no swelling, no edema, no deformity, no laceration, no pain and normal pulse.  Neurological: He is alert. He has normal reflexes. He exhibits normal muscle tone. Coordination normal.  Skin: No rash noted. He is not diaphoretic.  Nursing note and vitals reviewed.   ED Course  Procedures (including critical care time)  Labs  Review Labs Reviewed - No data to display  Imaging Review No results found.   Visual Acuity Review  Right Eye Distance:   Left Eye Distance:   Bilateral Distance:    Right Eye Near:   Left Eye Near:    Bilateral Near:         MDM   1. Sciatica, left    Discharge Medication List as of 08/03/2015  5:47 PM    START taking these medications   Details  cyclobenzaprine (FLEXERIL) 10 MG tablet Take 1 tablet (10 mg total) by mouth at bedtime., Starting 08/03/2015, Until Discontinued, Normal    predniSONE (DELTASONE) 20 MG tablet Take 1 tablet (20 mg total) by mouth daily., Starting 08/03/2015, Until Discontinued, Normal       1. diagnosis reviewed with patient 2. rx as per orders above; reviewed possible side effects, interactions, risks and benefits  3. Recommend supportive treatment with rest, heat 4. Follow-up prn if symptoms worsen or don't improve    Payton Mccallum, MD 08/04/15 [phone]

## 2015-08-15 ENCOUNTER — Encounter: Payer: Self-pay | Admitting: Emergency Medicine

## 2015-08-15 ENCOUNTER — Emergency Department
Admission: EM | Admit: 2015-08-15 | Discharge: 2015-08-15 | Disposition: A | Discharge status: Home / Self Care | Payer: BLUE CROSS/BLUE SHIELD | Attending: Emergency Medicine | Admitting: Emergency Medicine

## 2015-08-15 DIAGNOSIS — Z791 Long term (current) use of non-steroidal anti-inflammatories (NSAID): Secondary | ICD-10-CM | POA: Diagnosis not present

## 2015-08-15 DIAGNOSIS — M79605 Pain in left leg: Secondary | ICD-10-CM | POA: Diagnosis present

## 2015-08-15 DIAGNOSIS — Z792 Long term (current) use of antibiotics: Secondary | ICD-10-CM | POA: Insufficient documentation

## 2015-08-15 DIAGNOSIS — Z79899 Other long term (current) drug therapy: Secondary | ICD-10-CM | POA: Diagnosis not present

## 2015-08-15 DIAGNOSIS — M5432 Sciatica, left side: Secondary | ICD-10-CM | POA: Diagnosis not present

## 2015-08-15 MED ORDER — HYDROCODONE-ACETAMINOPHEN 5-325 MG PO TABS: 1.0000 | ORAL_TABLET | ORAL | Status: DC | PRN

## 2015-08-15 MED ORDER — BACLOFEN 10 MG PO TABS: 10.0000 mg | ORAL_TABLET | Freq: Three times a day (TID) | ORAL | Status: DC

## 2015-08-15 MED ORDER — PREDNISONE 10 MG (21) PO TBPK: ORAL_TABLET | ORAL | Status: DC

## 2015-08-15 NOTE — ED Provider Notes (Signed)
[facility] Emergency Department Provider Note ____________________________________________  Time seen: Approximately 5:32 PM  I have reviewed the triage vital signs and the nursing notes.   HISTORY  Chief Complaint Leg Pain    HPI Terry Shaffer is a 35 y.o. male who presents to the emergency department for evaluation of left lower back pain that radiates down the back of his thigh and just past his knee. He has had similar symptoms in the past and was treated with prednisone and some other medications that provided relief. Current symptoms started after bowling last night. Pain is worse when getting in and out of a car and sitting for long periods of time. He has not taken anything for pain today.  History reviewed. No pertinent past medical history.  There are no active problems to display for this patient.   Past Surgical History  Procedure Laterality Date  . Vasectomy Bilateral 10/12/14  . Hernia repair    . Parathyroidectomy      Current Outpatient Rx  Name  Route  Sig  Dispense  Refill  . baclofen (LIORESAL) 10 MG tablet   Oral   Take 1 tablet (10 mg total) by mouth 3 (three) times daily.   30 tablet   0   . benzonatate (TESSALON) 100 MG capsule   Oral   Take 2 capsules (200 mg total) by mouth every 8 (eight) hours.   21 capsule   0   . cefUROXime (CEFTIN) 500 MG tablet   Oral   Take 1 tablet (500 mg total) by mouth 2 (two) times daily.   20 tablet   0   . cefUROXime (CEFTIN) 500 MG tablet   Oral   Take 1 tablet (500 mg total) by mouth 2 (two) times daily.   20 tablet   0   . HYDROcodone-acetaminophen (NORCO/VICODIN) 5-325 MG tablet   Oral   Take 1 tablet by mouth every 4 (four) hours as needed for moderate pain.   20 tablet   0   . ibuprofen (ADVIL,MOTRIN) 800 MG tablet   Oral   Take 800 mg by mouth every 8 (eight) hours as needed for mild pain.          . meloxicam (MOBIC) 15 MG tablet   Oral   Take 1 tablet (15  mg total) by mouth daily.   30 tablet   1   . mupirocin ointment (BACTROBAN) 2 %   Topical   Apply 1 application topically 3 (three) times daily.   22 g   0   . predniSONE (STERAPRED UNI-PAK 21 TAB) 10 MG (21) TBPK tablet      Take 6 tablets on day 1 Take 5 tablets on day 2 Take 4 tablets on day 3 Take 3 tablets on day 4 Take 2 tablets on day 5 Take 1 tablet on day 6   21 tablet   0     Allergies Diphenhydramine and Doxycycline  Family History  Problem Relation Age of Onset  . Hypertension Father   . Cancer - Colon Mother   . Breast cancer Mother     Social History Social History  Substance Use Topics  . Smoking status: Never Smoker   . Smokeless tobacco: Never Used     Comment: Vape  . Alcohol Use: No    Review of Systems Constitutional: No recent illness. Eyes: No visual changes. ENT: No sore throat. Cardiovascular: Denies chest pain or palpitations. Respiratory: Denies shortness of breath. Gastrointestinal:  No abdominal pain.  Genitourinary: Negative for dysuria. Musculoskeletal: Pain in Left buttock with radiation into the left lower extremity Skin: Negative for rash. Neurological: Negative for headaches, focal weakness or numbness. 10-point ROS otherwise unremarkable.  ____________________________________________   PHYSICAL EXAM:  VITAL SIGNS: ED Triage Vitals  Enc Vitals Group     BP 08/15/15 1543 129/79 mmHg     Pulse Rate 08/15/15 1543 88     Resp 08/15/15 1543 18     Temp 08/15/15 1543 98.4 F (36.9 C)     Temp Source 08/15/15 1543 Oral     SpO2 08/15/15 1543 98 %     Weight 08/15/15 1543 208 lb (94.348 kg)     Height 08/15/15 1543 5\' 11"  (1.803 m)     Head Cir --      Peak Flow --      Pain Score 08/15/15 1545 10     Pain Loc --      Pain Edu? --      Excl. in GC? --     Constitutional: Alert and oriented. Well appearing and in no acute distress. Eyes: Conjunctivae are normal. EOMI. Head: Atraumatic. Nose: No  congestion/rhinnorhea. Neck: No stridor.  Respiratory: Normal respiratory effort.   Musculoskeletal: Straight leg raise positive at approximately 30. Neurologic:  Normal speech and language. No gross focal neurologic deficits are appreciated. Speech is normal. No gait instability. Skin:  Skin is warm, dry and intact. Atraumatic. Psychiatric: Mood and affect are normal. Speech and behavior are normal.  ____________________________________________   LABS (all labs ordered are listed, but only abnormal results are displayed)  Labs Reviewed - No data to display ____________________________________________  RADIOLOGY  Not indicated ____________________________________________   PROCEDURES  Procedure(s) performed: None   ____________________________________________   INITIAL IMPRESSION / ASSESSMENT AND PLAN / ED COURSE  Pertinent labs & imaging results that were available during my care of the patient were reviewed by me and considered in my medical decision making (see chart for details).  Patient was given a prescription for prednisone, baclofen, and hydrocodone. He was encouraged to take his ibuprofen every 6 hours. He was advised to follow-up with orthopedics for symptoms that are not improving with medication. He was advised to return to the emergency department for symptoms that change or worsen if he is unable schedule appointment. ____________________________________________   FINAL CLINICAL IMPRESSION(S) / ED DIAGNOSES  Final diagnoses:  Sciatica, left side       Chinita PesterCari B Epiphany Seltzer, FNP 08/15/15 1958  Maurilio LovelyNoelle McLaurin, MD 08/15/15 2342

## 2015-08-15 NOTE — ED Notes (Signed)
Pt reports left leg and buttock pain since last night.

## 2015-09-06 ENCOUNTER — Ambulatory Visit
Admission: EM | Admit: 2015-09-06 | Discharge: 2015-09-06 | Disposition: A | Discharge status: Home / Self Care | Payer: BLUE CROSS/BLUE SHIELD | Attending: Family Medicine | Admitting: Family Medicine

## 2015-09-06 ENCOUNTER — Encounter: Payer: Self-pay | Admitting: Emergency Medicine

## 2015-09-06 DIAGNOSIS — J111 Influenza due to unidentified influenza virus with other respiratory manifestations: Secondary | ICD-10-CM

## 2015-09-06 DIAGNOSIS — J029 Acute pharyngitis, unspecified: Secondary | ICD-10-CM

## 2015-09-06 LAB — RAPID INFLUENZA A&B ANTIGENS (ARMC ONLY)
INFLUENZA A (ARMC): NEGATIVE
INFLUENZA B (ARMC): NEGATIVE

## 2015-09-06 LAB — RAPID STREP SCREEN (MED CTR MEBANE ONLY): Streptococcus, Group A Screen (Direct): NEGATIVE

## 2015-09-06 MED ORDER — FEXOFENADINE-PSEUDOEPHED ER 180-240 MG PO TB24
1.0000 | ORAL_TABLET | Freq: Every day | ORAL | Status: DC
Start: 2015-09-06 — End: 2016-02-28

## 2015-09-06 MED ORDER — MELOXICAM 15 MG PO TABS: 15.0000 mg | ORAL_TABLET | Freq: Every day | ORAL | Status: DC

## 2015-09-06 MED ORDER — OSELTAMIVIR PHOSPHATE 75 MG PO CAPS: 75.0000 mg | ORAL_CAPSULE | Freq: Two times a day (BID) | ORAL | Status: DC

## 2015-09-06 MED ORDER — HYDROCOD POLST-CPM POLST ER 10-8 MG/5ML PO SUER: 5.0000 mL | Freq: Two times a day (BID) | ORAL | Status: DC | PRN

## 2015-09-06 NOTE — Discharge Instructions (Signed)
Influenza, Adult °Influenza (flu) is an infection in the mouth, nose, and throat (respiratory tract) caused by a virus. The flu can make you feel very ill. Influenza spreads easily from person to person (contagious).  °HOME CARE  °· Only take medicines as told by your doctor. °· Use a cool mist humidifier to make breathing easier. °· Get plenty of rest until your fever goes away. This usually takes 3 to 4 days. °· Drink enough fluids to keep your pee (urine) clear or pale yellow. °· Cover your mouth and nose when you cough or sneeze. °· Wash your hands well to avoid spreading the flu. °· Stay home from work or school until your fever has been gone for at least 1 full day. °· Get a flu shot every year. °GET HELP RIGHT AWAY IF:  °· You have trouble breathing or feel short of breath. °· Your skin or nails turn blue. °· You have severe neck pain or stiffness. °· You have a severe headache, facial pain, or earache. °· Your fever gets worse or keeps coming back. °· You feel sick to your stomach (nauseous), throw up (vomit), or have watery poop (diarrhea). °· You have chest pain. °· You have a deep cough that gets worse, or you cough up more thick spit (mucus). °MAKE SURE YOU:  °· Understand these instructions. °· Will watch your condition. °· Will get help right away if you are not doing well or get worse. °  °This information is not intended to replace advice given to you by your health care provider. Make sure you discuss any questions you have with your health care provider. °  °Document Released: 02/12/2008 Document Revised: 05/26/2014 Document Reviewed: 08/04/2011 °Elsevier Interactive Patient Education ©2016 [employer] °Influenza Tests °WHY AM I HAVING THIS TEST? °You may have an influenza test to help your health care provider determine what type of respiratory infection you have. The test may also be used to help determine a treatment plan and to monitor influenza activity within a community. °There are two  types of influenza virus: types A and B. Often, one strain of type A influenza will be the most common type of influenza in a community during flu season. This is typically between the months of October and May. Influenza tests can help determine which strain of influenza type A is occurring most often in the community. °WHAT KIND OF SAMPLE IS TAKEN? °Influenza tests are performed by collecting a small sample of fluids (secretions) from your nose or throat using a cotton swab. Tests performed on nasal secretions are more accurate than tests performed on a sample taken from your throat. °· Rapid influenza tests are available and have become the most frequently used tests for influenza. They are most accurate when completed within the first 48 hours after your symptoms begin. °· Depending on the method, a rapid influenza test may be completed in your health care provider's office in less than 30 minutes. It can also be sent to a lab with the results available the same day. °· Depending on the particular type of test used, it can identify influenza type A, a mixture of types A and B, or differentiate between type A and B. °· Another test that your health care provider may order is a viral culture. This also requires the collection of secretions from your nose or throat. The sample is then sent to a lab for processing. This may take several days to complete. °HOW ARE YOUR TEST RESULTS   REPORTED? °Your test results will be reported as either positive or negative. A false-negative result can occur. A false-negative result is incorrect because it indicates a condition or finding is not present when it is. °It is your responsibility to obtain your test results. Ask the lab or department performing the test when and how you will get your results. °WHAT DO THE RESULTS MEAN? °· A positive test means you have influenza. Tests may further determine the type of influenza you have. °· A negative influenza test result means it is  not likely that you have influenza. °· A false-negative result can occur. False-negative results are more likely to happen at the height of the influenza season. °Talk with your health care provider to discuss your results, treatment options, and if necessary, the need for more tests. Talk with your health care provider if you have any questions about your results. °  °This information is not intended to replace advice given to you by your health care provider. Make sure you discuss any questions you have with your health care provider. °  °Document Released: 02/12/2005 Document Revised: 05/26/2014 Document Reviewed: 09/21/2013 °Elsevier Interactive Patient Education ©2016 Elsevier Inc. ° °

## 2015-09-06 NOTE — ED Notes (Signed)
Patient c/o cough, runny nose, HAs, and bodyaches since last night.

## 2015-09-06 NOTE — ED Provider Notes (Signed)
CSN: [id number]     Arrival date & time 09/06/15  1633 History   First MD Initiated Contact with Patient 09/06/15 1701    Nurses notes were reviewed. Chief Complaint  Patient presents with  . Cough  . Nasal Congestion  . Generalized Body Aches   She reports generalized body ache nasal congestion cough and sore throat. Anything started yesterday he also had chills. Cough is nonproductive he did go to work today states he felt miserable. He did not get his flu shot this year. He's had a bilateral vasectomy. Father has hypertension mother's had breast and colon cancer. Does not smoke and he's had a parathyroidectomy for the past.   (Consider location/radiation/quality/duration/timing/severity/associated sxs/prior Treatment) Patient is a 35 y.o. male presenting with cough. The history is provided by the patient. No language interpreter was used.  Cough Cough characteristics:  Non-productive Severity:  Moderate Onset quality:  Sudden Timing:  Constant Progression:  Worsening Chronicity:  New Smoker: no   Context: upper respiratory infection   Relieved by:  Nothing Worsened by:  Nothing tried Associated symptoms: chills, headaches, myalgias, rhinorrhea, sinus congestion and sore throat   Associated symptoms: no chest pain, no ear fullness, no ear pain, no eye discharge, no fever and no shortness of breath     History reviewed. No pertinent past medical history. Past Surgical History  Procedure Laterality Date  . Vasectomy Bilateral 10/12/14  . Hernia repair    . Parathyroidectomy     Family History  Problem Relation Age of Onset  . Hypertension Father   . Cancer - Colon Mother   . Breast cancer Mother    Social History  Substance Use Topics  . Smoking status: Never Smoker   . Smokeless tobacco: Never Used     Comment: Vape  . Alcohol Use: No    Review of Systems  Constitutional: Positive for chills. Negative for fever.  HENT: Positive for rhinorrhea and sore throat.  Negative for ear pain.   Eyes: Negative for discharge.  Respiratory: Positive for cough. Negative for shortness of breath.   Cardiovascular: Negative for chest pain.  Musculoskeletal: Positive for myalgias.  Neurological: Positive for headaches.  All other systems reviewed and are negative.   Allergies  Diphenhydramine and Doxycycline  Home Medications   Prior to Admission medications   Medication Sig Start Date End Date Taking? Authorizing Provider  baclofen (LIORESAL) 10 MG tablet Take 1 tablet (10 mg total) by mouth 3 (three) times daily. 08/15/15   Chinita Pester, FNP  benzonatate (TESSALON) 100 MG capsule Take 2 capsules (200 mg total) by mouth every 8 (eight) hours. 03/27/15   Lutricia Feil, PA-C  cefUROXime (CEFTIN) 500 MG tablet Take 1 tablet (500 mg total) by mouth 2 (two) times daily. 03/17/15   Hassan Rowan, MD  cefUROXime (CEFTIN) 500 MG tablet Take 1 tablet (500 mg total) by mouth 2 (two) times daily. 04/01/15   Hassan Rowan, MD  chlorpheniramine-HYDROcodone [facility]) 10-8 MG/5ML SUER Take 5 mLs by mouth every 12 (twelve) hours as needed for cough. 09/06/15   Hassan Rowan, MD  fexofenadine-pseudoephedrine (ALLEGRA-D ALLERGY & CONGESTION) 180-240 MG 24 hr tablet Take 1 tablet by mouth daily. 09/06/15   Hassan Rowan, MD  HYDROcodone-acetaminophen (NORCO/VICODIN) 5-325 MG tablet Take 1 tablet by mouth every 4 (four) hours as needed for moderate pain. 08/15/15   Chinita Pester, FNP  ibuprofen (ADVIL,MOTRIN) 800 MG tablet Take 800 mg by mouth every 8 (eight) hours as needed for  mild pain.     Historical Provider, MD  meloxicam (MOBIC) 15 MG tablet Take 1 tablet (15 mg total) by mouth daily. 03/17/15   Hassan Rowan, MD  meloxicam (MOBIC) 15 MG tablet Take 1 tablet (15 mg total) by mouth daily. 09/06/15   Hassan Rowan, MD  mupirocin ointment (BACTROBAN) 2 % Apply 1 application topically 3 (three) times daily. 04/01/15   Hassan Rowan, MD  oseltamivir (TAMIFLU) 75 MG capsule  Take 1 capsule (75 mg total) by mouth 2 (two) times daily. 09/06/15   Hassan Rowan, MD  predniSONE (STERAPRED UNI-PAK 21 TAB) 10 MG (21) TBPK tablet Take 6 tablets on day 1 Take 5 tablets on day 2 Take 4 tablets on day 3 Take 3 tablets on day 4 Take 2 tablets on day 5 Take 1 tablet on day 6 08/15/15   Chinita Pester, FNP   Meds Ordered and Administered this Visit  Medications - No data to display  BP 119/77 mmHg  Pulse 66  Temp(Src) 98.1 F (36.7 C) (Tympanic)  Resp 16  Ht  (1.803 m)  Wt 206 lb (93.441 kg)  BMI 28.74 kg/m2  SpO2 100% No data found.   Physical Exam  Constitutional: He is oriented to person, place, and time. He appears well-developed and well-nourished.  HENT:  Head: Normocephalic and atraumatic.  Right Ear: Hearing, tympanic membrane, external ear and ear canal normal.  Left Ear: Hearing, tympanic membrane, external ear and ear canal normal.  Nose: Mucosal edema and rhinorrhea present. Right sinus exhibits no maxillary sinus tenderness. Left sinus exhibits no maxillary sinus tenderness.  Mouth/Throat: Uvula is midline. Normal dentition. No uvula swelling or dental caries. Posterior oropharyngeal erythema present.  Eyes: Conjunctivae are normal. Pupils are equal, round, and reactive to light.  Neck: Normal range of motion. Neck supple. No thyromegaly present.  Cardiovascular: Normal rate, regular rhythm and normal heart sounds.   Pulmonary/Chest: Effort normal and breath sounds normal. No respiratory distress.  Musculoskeletal: Normal range of motion. He exhibits no edema.  Lymphadenopathy:    He has cervical adenopathy.  Neurological: He is alert and oriented to person, place, and time.  Skin: Skin is warm.  Psychiatric: He has a normal mood and affect.  Vitals reviewed.   ED Course  Procedures (including critical care time)  Labs Review Labs Reviewed  RAPID INFLUENZA A&B ANTIGENS ([facility] ONLY)  RAPID STREP SCREEN (NOT AT [facility])  CULTURE, GROUP A  STREP [facility])   Results for orders placed or performed during the hospital encounter of 09/06/15  Rapid Influenza A&B Antigens ([facility] only)  Result Value Ref Range   Influenza A ([facility]) NEGATIVE NEGATIVE   Influenza B ([facility]) NEGATIVE NEGATIVE  Rapid strep screen  Result Value Ref Range   Streptococcus, Group A Screen (Direct) NEGATIVE NEGATIVE   Imaging Review No results found.   Visual Acuity Review  Right Eye Distance:   Left Eye Distance:   Bilateral Distance:    Right Eye Near:   Left Eye Near:    Bilateral Near:      Results for orders placed or performed during the hospital encounter of 09/06/15  Rapid Influenza A&B Antigens ([facility] only)  Result Value Ref Range   Influenza A ([facility]) NEGATIVE NEGATIVE   Influenza B ([facility]) NEGATIVE NEGATIVE  Rapid strep screen  Result Value Ref Range   Streptococcus, Group A Screen (Direct) NEGATIVE NEGATIVE     MDM   1. Flu syndrome   2. Pharyngitis  Patient be placed on Tamiflu 75 mg twice a day, Allegra-D 24 hours test next 1 teaspoon twice a day and Mobic 15 mg as needed for the myalgia. Patient that even though the flu test may be negative as is his symptoms are still classic with new onset of flu that started yesterday and he'll be a good candidate for Tamiflu. Recommend flu shot in the future work note for tomorrow and Saturday. Pop PCP if not better by Monday   Note: This dictation was prepared with Dragon dictation along with smaller phrase technology. Any transcriptional errors that result from this process are unintentional.    Hassan RowanEugene Jayshawn Colston, MD 09/06/15 [phone]

## 2015-09-08 LAB — CULTURE, GROUP A STREP (THRC)

## 2015-11-09 ENCOUNTER — Ambulatory Visit
Admission: EM | Admit: 2015-11-09 | Discharge: 2015-11-09 | Disposition: A | Discharge status: Home / Self Care | Payer: BLUE CROSS/BLUE SHIELD | Attending: Family Medicine | Admitting: Family Medicine

## 2015-11-09 DIAGNOSIS — S50861A Insect bite (nonvenomous) of right forearm, initial encounter: Secondary | ICD-10-CM | POA: Diagnosis not present

## 2015-11-09 DIAGNOSIS — W57XXXA Bitten or stung by nonvenomous insect and other nonvenomous arthropods, initial encounter: Secondary | ICD-10-CM | POA: Diagnosis not present

## 2015-11-09 MED ORDER — SULFAMETHOXAZOLE-TRIMETHOPRIM 800-160 MG PO TABS: 1.0000 | ORAL_TABLET | Freq: Two times a day (BID) | ORAL | Status: AC

## 2015-11-09 MED ORDER — PREDNISONE 10 MG PO TABS: ORAL_TABLET | ORAL | Status: DC

## 2015-11-09 NOTE — ED Notes (Signed)
Patient complains of a spider bite on his way to work this morning. Patient states that he felt the bite but, did not actually see the spider. Patient has a notable swollen bite forearm with swelling. Patient describes the pain as a tightness and tender to the touch, with redness around bite. Patient states that the bite has been getting progressively worse throughout the day.

## 2015-11-09 NOTE — ED Provider Notes (Signed)
[facility]  ____________________________________________  Time seen: Approximately 7:03 PM  I have reviewed the triage vital signs and the nursing notes.   HISTORY  Chief Complaint Insect Bite  HPI Terry Shaffer is a 35 y.o. male presents for the complaints of spider bite to right forearm this morning. States he was wiping a spider web off his car door, and states while doing this he felt a bite to his right forearm. States did not see the actual insect but states he is sure it was a spider. States he had same thing happen to him a few years ago as well. Reports gradually as the day progressed he gradually had swelling at site of spider bite. States area is painful as it is "tight" and some tenderness. Denies pain radiation. Denies any numbness or tingling sensation. Denies any sensation or motor changes or deficits. Reports has continued to work and use right arm all day. Denies fall or direct trauma.   Denies fevers, chest pain, shortness of breath,facial or oral or throat swelling, wheezing, rash or other complaints.    History reviewed. No pertinent past medical history.  There are no active problems to display for this patient.   Past Surgical History  Procedure Laterality Date  . Vasectomy Bilateral 10/12/14  . Hernia repair    . Parathyroidectomy     Current Outpatient Rx  Name  Route  Sig  Dispense  Refill                        .           .           .           .           .           .           .           .           .           .           .           .             Allergies Diphenhydramine and Doxycycline   Reports tolerates claritin well.   Family History  Problem Relation Age of Onset  . Hypertension Father   . Cancer - Colon Mother   . Breast cancer Mother     Social History Social History  Substance Use Topics  . Smoking status: Current Every Day Smoker -- 0.25 packs/day    Types: Cigarettes  . Smokeless tobacco:  Never Used     Comment: Vape  . Alcohol Use: No    Review of Systems Constitutional: No fever/chills Eyes: No visual changes. ENT: No sore throat. Cardiovascular: Denies chest pain. Respiratory: Denies shortness of breath. Gastrointestinal: No abdominal pain.  No nausea, no vomiting.  No diarrhea.  No constipation. Genitourinary: Negative for dysuria. Musculoskeletal: Negative for back pain. Skin: Negative for rash. As above.  Neurological: Negative for headaches, focal weakness or numbness.  10-point ROS otherwise negative.  ____________________________________________   PHYSICAL EXAM:  VITAL SIGNS: ED Triage Vitals  Enc Vitals Group     BP 11/09/15 1746 118/73 mmHg     Pulse Rate 11/09/15 1746 69     Resp 11/09/15 1746 17  Temp 11/09/15 1746 97.9 F (36.6 C)     Temp Source 11/09/15 1746 Oral     SpO2 11/09/15 1746 98 %     Weight 11/09/15 1746 207 lb (93.895 kg)     Height 11/09/15 1746 5\' 11"  (1.803 m)     Head Cir --      Peak Flow --      Pain Score 11/09/15 1745 9     Pain Loc --      Pain Edu? --      Excl. in GC? --     Constitutional: Alert and oriented. Well appearing and in no acute distress. Eyes: Conjunctivae are normal. PERRL. EOMI. Head: Atraumatic.  Ears: normal external appearance bilaterally.   Nose: No congestion/rhinnorhea.  Mouth/Throat: Mucous membranes are moist.  Oropharynx non-erythematous. No oral facial swelling.  Neck: No stridor.  No cervical spine tenderness to palpation. Hematological/Lymphatic/Immunilogical: No cervical lymphadenopathy. Cardiovascular: Normal rate, regular rhythm. Grossly normal heart sounds.  Good peripheral circulation. Respiratory: Normal respiratory effort.  No retractions. Lungs CTAB. Gastrointestinal: Soft and nontender. No distention.  No CVA tenderness. Musculoskeletal: No lower or upper extremity tenderness nor edema.  Neurologic:  Normal speech and language. No gross focal neurologic deficits are  appreciated. No gait instability. Skin:  Skin is warm, dry and intact. No rash noted. Except: Right distal forearm 4x5cm area of mild swelling with minimal erythema with center punctum, nontender, no fluctuance or induration, no drainage, sensation intact, bilateral hand grips strong and equal, capillary refill <2 seconds to right hand distal fingers. No other rash or swelling noted.  Psychiatric: Mood and affect are normal. Speech and behavior are normal.  ____________________________________________   LABS (all labs ordered are listed, but only abnormal results are displayed)  Labs Reviewed - No data to display ______________________________________________   INITIAL IMPRESSION / ASSESSMENT AND PLAN / ED COURSE  Pertinent labs & imaging results that were available during my care of the patient were reviewed by me and considered in my medical decision making (see chart for details).  Well appearing patient, no acute distress. Presents for right forearm spider bite complaint. Denies other complaints. Right forearm suspect insect bite with local reaction. Neurovascular intact. Doubt infection, however area mild erythema and itching. Will treat patient with prednisone taper, and bactrim. Encouraged not to scratch. Encouraged ice and elevation and monitoring. Encourage PCP follow up. Discussed indication, risks and benefits of medications with patient.  Discussed follow up with Primary care physician this week. Discussed follow up and return parameters including no resolution or any worsening concerns. Patient verbalized understanding and agreed to plan.   ____________________________________________   FINAL CLINICAL IMPRESSION(S) / ED DIAGNOSES  Final diagnoses:  Insect bite of right forearm with local reaction, initial encounter     Discharge Medication List as of 11/09/2015  6:41 PM    START taking these medications   Details  predniSONE (DELTASONE) 10 MG tablet Start 60 mg po day  one, then 50 mg po day two, taper by 10 mg daily until complete., Normal    sulfamethoxazole-trimethoprim (BACTRIM DS,SEPTRA DS) 800-160 MG tablet Take 1 tablet by mouth 2 (two) times daily., Starting 11/09/2015, Until Fri 11/16/15, Normal        Note: This dictation was prepared with Dragon dictation along with smaller phrase technology. Any transcriptional errors that result from this process are unintentional.      Renford DillsLindsey Aritha Huckeba, NP 11/20/15 2050

## 2015-11-09 NOTE — Discharge Instructions (Signed)
Take medication as prescribed. Drink plenty of fluids. Apply ice and elevate.  Follow up with your primary care physician this week as needed. Return to Urgent care for new or worsening concerns.    Insect Bite Mosquitoes, flies, fleas, bedbugs, and many other insects can bite. Insect bites are different from insect stings. A sting is when poison (venom) is injected into the skin. Insect bites can cause pain or itching for a few days, but they are usually not serious. Some insects can spread diseases to people through a bite. SYMPTOMS  Symptoms of an insect bite include:  Itching or pain in the bite area.  Redness and swelling in the bite area.  An open wound (skin ulcer). In many cases, symptoms last for 2-4 days.  DIAGNOSIS  This condition is usually diagnosed based on symptoms and a physical exam. TREATMENT  Treatment is usually not needed for an insect bite. Symptoms often go away on their own. Your health care provider may recommend creams or lotions to help reduce itching. Antibiotic medicines may be prescribed if the bite becomes infected. A tetanus shot may be given in some cases. If you develop an allergic reaction to an insect bite, your health care provider will prescribe medicines to treat the reaction (antihistamines). This is rare. HOME CARE INSTRUCTIONS  Do not scratch the bite area.  Keep the bite area clean and dry. Wash the bite area daily with soap and water as told by your health care provider.  If directed, applyice to the bite area.  Put ice in a plastic bag.  Place a towel between your skin and the bag.  Leave the ice on for 20 minutes, 2-3 times per day.  To help reduce itching and swelling, try applying a baking soda paste, cortisone cream, or calamine lotion to the bite area as told by your health care provider.  Apply or take over-the-counter and prescription medicines only as told by your health care provider.  If you were prescribed an antibiotic  medicine, use it as told by your health care provider. Do not stop using the antibiotic even if your condition improves.  Keep all follow-up visits as told by your health care provider. This is important. PREVENTION   Use insect repellent. The best insect repellents contain:  DEET, picaridin, oil of lemon eucalyptus (OLE), or IR3535.  Higher amounts of an active ingredient.  When you are outdoors, wear clothing that covers your arms and legs.  Avoid opening windows that do not have window screens. SEEK MEDICAL CARE IF:  You have increased redness, swelling, or pain in the bite area.  You have a fever. SEEK IMMEDIATE MEDICAL CARE IF:   You have joint pain.   You have fluid, blood, or pus coming from the bite area.  You have a headache or neck pain.  You have unusual weakness.  You have a rash.  You have chest pain or shortness of breath.  You have abdominal pain, nausea, or vomiting.  You feel unusually tired or sleepy.   This information is not intended to replace advice given to you by your health care provider. Make sure you discuss any questions you have with your health care provider.   Document Released: 06/12/2004 Document Revised: 01/24/2015 Document Reviewed: 09/20/2014 Elsevier Interactive Patient Education [employer]2016 Else[employer] Inc.

## 2015-11-21 ENCOUNTER — Encounter: Payer: Self-pay | Admitting: Emergency Medicine

## 2015-11-21 ENCOUNTER — Emergency Department
Admission: EM | Admit: 2015-11-21 | Discharge: 2015-11-21 | Disposition: A | Discharge status: Home / Self Care | Payer: BLUE CROSS/BLUE SHIELD | Attending: Emergency Medicine | Admitting: Emergency Medicine

## 2015-11-21 DIAGNOSIS — F17210 Nicotine dependence, cigarettes, uncomplicated: Secondary | ICD-10-CM | POA: Insufficient documentation

## 2015-11-21 DIAGNOSIS — S90862A Insect bite (nonvenomous), left foot, initial encounter: Secondary | ICD-10-CM | POA: Insufficient documentation

## 2015-11-21 DIAGNOSIS — Y929 Unspecified place or not applicable: Secondary | ICD-10-CM | POA: Diagnosis not present

## 2015-11-21 DIAGNOSIS — Z79899 Other long term (current) drug therapy: Secondary | ICD-10-CM | POA: Insufficient documentation

## 2015-11-21 DIAGNOSIS — Y999 Unspecified external cause status: Secondary | ICD-10-CM | POA: Insufficient documentation

## 2015-11-21 DIAGNOSIS — Y939 Activity, unspecified: Secondary | ICD-10-CM | POA: Insufficient documentation

## 2015-11-21 DIAGNOSIS — W57XXXA Bitten or stung by nonvenomous insect and other nonvenomous arthropods, initial encounter: Secondary | ICD-10-CM | POA: Insufficient documentation

## 2015-11-21 MED ORDER — MUPIROCIN 2 % EX OINT: 1.0000 "application " | TOPICAL_OINTMENT | Freq: Three times a day (TID) | CUTANEOUS | Status: DC

## 2015-11-21 MED ORDER — NAPROXEN 500 MG PO TABS: 500.0000 mg | ORAL_TABLET | Freq: Two times a day (BID) | ORAL | Status: DC

## 2015-11-21 NOTE — Discharge Instructions (Signed)
Insect Bite °Mosquitoes, flies, fleas, bedbugs, and other insects can bite. Insect bites are different from insect stings. The bite may be red, puffy (swollen), and itchy for 2 to 4 days. Most bites get better on their own. °HOME CARE  °· Do not scratch the bite. °· Keep the bite clean and dry. Wash the bite with soap and water every day, as told by your doctor. °· If directed, apply ice to the bite area. °¨ Put ice in a plastic bag. °¨ Place a towel between your skin and the bag. °¨ Leave the ice on for 20 minutes, 2-3 times per day. °· Follow instructions from your doctor about using medicated lotions or creams. These can help with itching. °· Apply or take over-the-counter and prescription medicines only as told by your doctor. °· If you were given an antibiotic medicine, use it as told by your doctor. Do not stop using the medicine even if your condition improves. °· Keep all follow-up visits as told by your doctor. This is important. °GET HELP IF: °· You have redness, swelling (inflammation), or pain near your bite that is getting worse. °· You have a fever. °GET HELP RIGHT AWAY IF:  °· You have joint pain.   °· You have fluid, blood, or pus coming from the bite area.   °· You have a headache. °· You have neck pain. °· You feel weaker than you normally do.   °· You have a rash.   °· You have chest pain. °· You have shortness of breath. °· You have stomach pain, feel sick to your stomach (nauseous), or throw up (vomit). °· You feel more tired or sleepy than you normally do. °  °This information is not intended to replace advice given to you by your health care provider. Make sure you discuss any questions you have with your health care provider. °  °Document Released: 05/02/2000 Document Revised: 01/24/2015 Document Reviewed: 09/20/2014 °Elsevier Interactive Patient Education ©2016 Elsevier Inc. ° °

## 2015-11-21 NOTE — ED Provider Notes (Signed)
[facility] Emergency Department Provider Note   ____________________________________________  Time seen: Approximately 2:17 PM  I have reviewed the triage vital signs and the nursing notes.   HISTORY  Chief Complaint Insect Bite    HPI Terry Shaffer is a 35 y.o. male patient complaint insect bite to between the fourth and fifth digit of the left foot. Patient states the incident occurred 3 days ago. Patient stated this intense itching and some redness on the top of his foot. No palliative measures taken for this complaint.Patient recently treated for insect bite to the right forearm prednisone and Bactrim on 11/09/2015.Marland Kitchen. Patient rates his pain discomfort as a 9/10.   History reviewed. No pertinent past medical history.  There are no active problems to display for this patient.   Past Surgical History  Procedure Laterality Date  . Vasectomy Bilateral 10/12/14  . Hernia repair    . Parathyroidectomy      Current Outpatient Rx  Name  Route  Sig  Dispense  Refill  . baclofen (LIORESAL) 10 MG tablet   Oral   Take 1 tablet (10 mg total) by mouth 3 (three) times daily.   30 tablet   0   . benzonatate (TESSALON) 100 MG capsule   Oral   Take 2 capsules (200 mg total) by mouth every 8 (eight) hours.   21 capsule   0   . cefUROXime (CEFTIN) 500 MG tablet   Oral   Take 1 tablet (500 mg total) by mouth 2 (two) times daily.   20 tablet   0   . cefUROXime (CEFTIN) 500 MG tablet   Oral   Take 1 tablet (500 mg total) by mouth 2 (two) times daily.   20 tablet   0   . chlorpheniramine-HYDROcodone (TUSSIONEX PENNKINETIC ER) 10-8 MG/5ML SUER   Oral   Take 5 mLs by mouth every 12 (twelve) hours as needed for cough.   115 mL   0   . fexofenadine-pseudoephedrine (ALLEGRA-D ALLERGY & CONGESTION) 180-240 MG 24 hr tablet   Oral   Take 1 tablet by mouth daily.   30 tablet   0   . HYDROcodone-acetaminophen (NORCO/VICODIN) 5-325 MG tablet  Oral   Take 1 tablet by mouth every 4 (four) hours as needed for moderate pain.   20 tablet   0   . ibuprofen (ADVIL,MOTRIN) 800 MG tablet   Oral   Take 800 mg by mouth every 8 (eight) hours as needed for mild pain.          . meloxicam (MOBIC) 15 MG tablet   Oral   Take 1 tablet (15 mg total) by mouth daily.   30 tablet   1   . meloxicam (MOBIC) 15 MG tablet   Oral   Take 1 tablet (15 mg total) by mouth daily.   30 tablet   0   . mupirocin ointment (BACTROBAN) 2 %   Topical   Apply 1 application topically 3 (three) times daily.   22 g   0   . naproxen (NAPROSYN) 500 MG tablet   Oral   Take 1 tablet (500 mg total) by mouth 2 (two) times daily with a meal.   20 tablet   0   . oseltamivir (TAMIFLU) 75 MG capsule   Oral   Take 1 capsule (75 mg total) by mouth 2 (two) times daily.   10 capsule   0   . predniSONE (DELTASONE) 10 MG tablet  Start 60 mg po day one, then 50 mg po day two, taper by 10 mg daily until complete.   21 tablet   0     Allergies Diphenhydramine and Doxycycline  Family History  Problem Relation Age of Onset  . Hypertension Father   . Cancer - Colon Mother   . Breast cancer Mother     Social History Social History  Substance Use Topics  . Smoking status: Current Every Day Smoker -- 0.25 packs/day    Types: Cigarettes  . Smokeless tobacco: Never Used     Comment: Vape  . Alcohol Use: No    Review of Systems Constitutional: No fever/chills Eyes: No visual changes. ENT: No sore throat. Cardiovascular: Denies chest pain. Respiratory: Denies shortness of breath. Gastrointestinal: No abdominal pain.  No nausea, no vomiting.  No diarrhea.  No constipation. Genitourinary: Negative for dysuria. Musculoskeletal: Negative for back pain. Skin: Mild edema and erythema to the dorsal aspect lateral left foot.. Neurological: Negative for headaches, focal weakness or numbness. Allergic/Immunilogical: Benadryl and doxycycline    ____________________________________________   PHYSICAL EXAM:  VITAL SIGNS: ED Triage Vitals  Enc Vitals Group     BP 11/21/15 1321 136/81 mmHg     Pulse Rate 11/21/15 1321 100     Resp 11/21/15 1321 18     Temp 11/21/15 1321 97.8 F (36.6 C)     Temp Source 11/21/15 1321 Oral     SpO2 11/21/15 1321 97 %     Weight 11/21/15 1321 209 lb (94.802 kg)     Height 11/21/15 1321  (1.803 m)     Head Cir --      Peak Flow --      Pain Score 11/21/15 1326 9     Pain Loc --      Pain Edu? --      Excl. in GC? --     Constitutional: Alert and oriented. Well appearing and in no acute distress. Eyes: Conjunctivae are normal. PERRL. EOMI. Head: Atraumatic. Nose: No congestion/rhinnorhea. Mouth/Throat: Mucous membranes are moist.  Oropharynx non-erythematous. Neck: No stridor. No cervical spine tenderness to palpation. Hematological/Lymphatic/Immunilogical: No cervical lymphadenopathy. Cardiovascular: Normal rate, regular rhythm. Grossly normal heart sounds.  Good peripheral circulation. Respiratory: Normal respiratory effort.  No retractions. Lungs CTAB. Gastrointestinal: Soft and nontender. No distention. No abdominal bruits. No CVA tenderness. Musculoskeletal: No lower extremity tenderness nor edema.  No joint effusions. Neurologic:  Normal speech and language. No gross focal neurologic deficits are appreciated. No gait instability. Skin:  Skin is warm, dry and intact.Erythema and mild edema radiating from between web spaced of the fourth and fifth digit to the dorsal aspect of the lateral foot.  Psychiatric: Mood and affect are normal. Speech and behavior are normal.  ____________________________________________   LABS (all labs ordered are listed, but only abnormal results are displayed)  Labs Reviewed - No data to  display ____________________________________________  EKG   ____________________________________________  RADIOLOGY   ____________________________________________   PROCEDURES  Procedure(s) performed: None  Procedures  Critical Care performed: No  ____________________________________________   INITIAL IMPRESSION / ASSESSMENT AND PLAN / ED COURSE  Pertinent labs & imaging results that were available during my care of the patient were reviewed by me and considered in my medical decision making (see chart for details).  Focal cellulitis dorsal aspect of left foot secondary to insect bite. Patient given a prescription for Bactroban and naproxen. Advised to follow-up with family doctor condition persists. ____________________________________________   FINAL CLINICAL IMPRESSION(S) /  ED DIAGNOSES  Final diagnoses:  Insect bite (nonvenomous), left foot, initial encounter (CODE)      NEW MEDICATIONS STARTED DURING THIS VISIT:  New Prescriptions   NAPROXEN (NAPROSYN) 500 MG TABLET    Take 1 tablet (500 mg total) by mouth 2 (two) times daily with a meal.     Note:  This document was prepared using Dragon voice recognition software and may include unintentional dictation errors.    Joni ReiningRonald K Michell Giuliano, PA-C 11/21/15 1429  Nita Sicklearolina Veronese, MD 11/21/15 [phone]

## 2015-11-21 NOTE — ED Notes (Signed)
Pt presents to ED with reports of insect bite to his left foot and itching rash on body. Pt in no apparent distress in triage.

## 2015-11-23 ENCOUNTER — Ambulatory Visit
Admission: EM | Admit: 2015-11-23 | Discharge: 2015-11-23 | Disposition: A | Discharge status: Home / Self Care | Payer: BLUE CROSS/BLUE SHIELD | Attending: Family Medicine | Admitting: Family Medicine

## 2015-11-23 DIAGNOSIS — L03116 Cellulitis of left lower limb: Secondary | ICD-10-CM

## 2015-11-23 LAB — BASIC METABOLIC PANEL
Anion gap: 4 — ABNORMAL LOW (ref 5–15)
BUN: 12 mg/dL (ref 6–20)
CO2: 29 mmol/L (ref 22–32)
Calcium: 9.2 mg/dL (ref 8.9–10.3)
Chloride: 106 mmol/L (ref 101–111)
Creatinine, Ser: 1.16 mg/dL (ref 0.61–1.24)
GFR calc Af Amer: >60 mL/min (ref >60)
GFR calc non Af Amer: >60 mL/min (ref >60)
Glucose, Bld: 100 mg/dL — ABNORMAL HIGH (ref 65–99)
Potassium: 4.2 mmol/L (ref 3.5–5.1)
Sodium: 139 mmol/L (ref 135–145)

## 2015-11-23 LAB — CBC WITH DIFFERENTIAL/PLATELET
Basophils Absolute: 0.1 10*3/uL (ref 0–0.1)
Basophils Relative: 1 %
Eosinophils Absolute: 0.4 10*3/uL (ref 0–0.7)
Eosinophils Relative: 5 %
HCT: 40.8 % (ref 40.0–52.0)
Hemoglobin: 13.4 g/dL (ref 13.0–18.0)
Lymphocytes Relative: 25 %
Lymphs Abs: 1.9 10*3/uL (ref 1.0–3.6)
MCH: 26.5 pg (ref 26.0–34.0)
MCHC: 32.7 g/dL (ref 32.0–36.0)
MCV: 81.1 fL (ref 80.0–100.0)
Monocytes Absolute: 0.8 10*3/uL (ref 0.2–1.0)
Monocytes Relative: 10 %
Neutro Abs: 4.5 10*3/uL (ref 1.4–6.5)
Neutrophils Relative %: 59 %
Platelets: 214 10*3/uL (ref 150–440)
RBC: 5.03 MIL/uL (ref 4.40–5.90)
RDW: 13.9 % (ref 11.5–14.5)
WBC: 7.5 10*3/uL (ref 3.8–10.6)

## 2015-11-23 MED ORDER — CEFUROXIME AXETIL 500 MG PO TABS: 500.0000 mg | ORAL_TABLET | Freq: Two times a day (BID) | ORAL | Status: DC

## 2015-11-23 NOTE — ED Provider Notes (Signed)
CSN: [id number]     Arrival date & time 11/23/15  1023 History   First MD Initiated Contact with Patient 11/23/15 1112     Chief Complaint  Patient presents with  . Insect Bite   ([id number] location/radiation/quality/duration/timing/severity/associated sxs/prior Treatment) HPI  35 year old male who presents with foot pain from a suspected insect bite. He was seen in the emergency room on Monday given a prescription for Bactroban and Naprosyn but despite use of the Bactroban has not improved. He has most of his pain redness and swelling over the base of the fourth fifth toes extending approximately hands breath into the forefoot. He says extremely tender to the touch and hurts with any movement of the toes. He said no fever or chills. He was not started on antibiotics. In addition he has noticed a yes rash on his arms and flank that developed but he states that it is fading somewhat at the present time. 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 He does not remember seeing any insects on his feet.     History reviewed. No pertinent past medical history. Past Surgical History  Procedure Laterality Date  . Vasectomy Bilateral 10/12/14  . Hernia repair    . Parathyroidectomy     Family History  Problem Relation Age of Onset  . Hypertension Father   . Cancer - Colon Mother   . Breast cancer Mother    Social History  Substance Use Topics  . Smoking status: Current Every Day Smoker -- 0.25 packs/day    Types: Cigarettes  . Smokeless tobacco: Never Used     Comment: Vape  . Alcohol Use: No    Review of Systems  Allergies  Diphenhydramine and Doxycycline  Home Medications   Prior to Admission medications   Medication Sig Start Date End Date Taking? Authorizing Provider  mupirocin ointment (BACTROBAN) 2 % Apply 1 application topically 3 (three) times daily. 11/21/15  Yes Joni Reiningonald K Smith, PA-C  naproxen (NAPROSYN) 500 MG tablet Take  1 tablet (500 mg total) by mouth 2 (two) times daily with a meal. 11/21/15  Yes Joni Reiningonald K Smith, PA-C  baclofen (LIORESAL) 10 MG tablet Take 1 tablet (10 mg total) by mouth 3 (three) times daily. 08/15/15   Chinita Pesterari B Triplett, FNP  benzonatate (TESSALON) 100 MG capsule Take 2 capsules (200 mg total) by mouth every 8 (eight) hours. 03/27/15   Lutricia FeilWilliam P Naseem Varden, PA-C  cefUROXime (CEFTIN) 500 MG tablet Take 1 tablet (500 mg total) by mouth 2 (two) times daily with a meal. 11/23/15   Lutricia FeilWilliam P Casy Brunetto, PA-C  chlorpheniramine-HYDROcodone [facility](TUSSIONEX PENNKINETIC ER) 10-8 MG/5ML SUER Take 5 mLs by mouth every 12 (twelve) hours as needed for cough. 09/06/15   Hassan RowanEugene Wade, MD  fexofenadine-pseudoephedrine (ALLEGRA-D ALLERGY & CONGESTION) 180-240 MG 24 hr tablet Take 1 tablet by mouth daily. 09/06/15   Hassan RowanEugene Wade, MD  HYDROcodone-acetaminophen (NORCO/VICODIN) 5-325 MG tablet Take 1 tablet by mouth every 4 (four) hours as needed for moderate pain. 08/15/15   Chinita Pesterari B Triplett, FNP  ibuprofen (ADVIL,MOTRIN) 800 MG tablet Take 800 mg by mouth every 8 (eight) hours as needed for mild pain.     Historical Provider, MD  meloxicam (MOBIC) 15 MG tablet Take 1 tablet (15 mg total) by mouth daily. 03/17/15   Hassan RowanEugene Wade, MD  meloxicam (MOBIC) 15 MG tablet Take 1 tablet (15 mg total) by mouth daily. 09/06/15   Hassan RowanEugene Wade, MD  oseltamivir (  TAMIFLU) 75 MG capsule Take 1 capsule (75 mg total) by mouth 2 (two) times daily. 09/06/15   Hassan RowanEugene Wade, MD  predniSONE (DELTASONE) 10 MG tablet Start 60 mg po day one, then 50 mg po day two, taper by 10 mg daily until complete. 11/09/15   Renford DillsLindsey Miller, NP   Meds Ordered and Administered this Visit  Medications - No data to display  BP 120/77 mmHg  Pulse 71  Temp(Src) 97.6 F (36.4 C) (Tympanic)  Resp 17  Ht 5\' 11"  (1.803 m)  Wt 210 lb (95.255 kg)  BMI 29.30 kg/m2  SpO2 98% No data found.   Physical Exam  Constitutional: He is oriented to person, place, and time. He appears well-developed  and well-nourished. No distress.  HENT:  Head: Normocephalic and atraumatic.  Eyes: Conjunctivae are normal. Pupils are equal, round, and reactive to light.  Neck: Normal range of motion. Neck supple.  Abdominal: Soft. Bowel sounds are normal.  Musculoskeletal: Normal range of motion. He exhibits edema and tenderness.  Neurological: He is alert and oriented to person, place, and time.  Skin: Skin is warm and dry. He is not diaphoretic. There is erythema.  Examination of the left foot shows swelling erythema of the distal lateral foot at the base of the third and fifth digits. The area of erythema spreads proximal one hand's breath into the forefoot. Interdigital between the fifth and fourth toes shows a fissure from tinea pedis. Is no maceration seen. No Visible marks are seen from insect bite. Is no evidence of any ascending lymphangitis  Psychiatric: He has a normal mood and affect. His behavior is normal. Judgment and thought content normal.  Nursing note and vitals reviewed.   ED Course  Procedures (including critical care time)  Labs Review Labs Reviewed  BASIC METABOLIC PANEL - Abnormal; Notable for the following:    Glucose, Bld 100 (*)    Anion gap 4 (*)    All other components within normal limits  CBC WITH DIFFERENTIAL/PLATELET    Imaging Review No results found.   Visual Acuity Review  Right Eye Distance:   Left Eye Distance:   Bilateral Distance:    Right Eye Near:   Left Eye Near:    Bilateral Near:         MDM   1. Cellulitis of left foot    Discharge Medication List as of 11/23/2015 12:21 PM    Patient was also examined by Dr. Izell Carolinaonti. This patient has an obvious cellulitis of the left foot dorsum. This could possibly represent a is from tinea pedis skin fissure but because of his rash and not improving on topical Bactroban is possible that this could be from tick bite. 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 It is too early to obtain titers at this point. And to complicate matters the patient states he has to allergy to doxycycline which is jitteriness and while not an absolute contraindication we will attempt to treat him with a secondary antibiotic that will resolve his cellulitis and also give coverage for Lyme disease but not for [facility] spotted fever. This is fully explained to the patient. If He did not respond to the Ceftin a second generation cephalosporin then I have recommended that he be referred to an infectious disease physician for evaluation and definitive treatment. In meantime I have recommended that he elevate the foot to help with the edema. Return to clinic if  he is not improving or follow up with his primary care physician. He needs to get a new one since his other primary care physician has retired. He always could return to the emergency department if necessary  Lutricia Feil, PA-C 11/23/15 1752

## 2015-11-23 NOTE — ED Notes (Signed)
Patient complains of left foot pain from insect bite. Patient states that he was seen at ED on Monday but site has worsened. Patient has redness and swelling around bite on left foot. Patient states that he was given Bactroban ointment and Naprosyn for pain. Patient states that he was not given antibiotics.

## 2015-11-23 NOTE — Discharge Instructions (Signed)

## 2016-02-13 ENCOUNTER — Emergency Department: Payer: BLUE CROSS/BLUE SHIELD

## 2016-02-13 ENCOUNTER — Encounter: Payer: Self-pay | Admitting: Emergency Medicine

## 2016-02-13 ENCOUNTER — Emergency Department
Admission: EM | Admit: 2016-02-13 | Discharge: 2016-02-13 | Disposition: A | Discharge status: Home / Self Care | Payer: BLUE CROSS/BLUE SHIELD | Attending: Emergency Medicine | Admitting: Emergency Medicine

## 2016-02-13 DIAGNOSIS — R1032 Left lower quadrant pain: Secondary | ICD-10-CM | POA: Insufficient documentation

## 2016-02-13 DIAGNOSIS — R109 Unspecified abdominal pain: Secondary | ICD-10-CM

## 2016-02-13 DIAGNOSIS — F17210 Nicotine dependence, cigarettes, uncomplicated: Secondary | ICD-10-CM | POA: Insufficient documentation

## 2016-02-13 HISTORY — DX: Calculus of kidney: N20.0

## 2016-02-13 LAB — CBC
HEMATOCRIT: 43.9 % (ref 40.0–52.0)
HEMOGLOBIN: 14.5 g/dL (ref 13.0–18.0)
MCH: 26.8 pg (ref 26.0–34.0)
MCHC: 33 g/dL (ref 32.0–36.0)
MCV: 81.3 fL (ref 80.0–100.0)
Platelets: 243 10*3/uL (ref 150–440)
RBC: 5.4 MIL/uL (ref 4.40–5.90)
RDW: 14.1 % (ref 11.5–14.5)
WBC: 8.2 10*3/uL (ref 3.8–10.6)

## 2016-02-13 LAB — URINALYSIS COMPLETE WITH MICROSCOPIC (ARMC ONLY)
Bacteria, UA: NONE SEEN
Bilirubin Urine: NEGATIVE
GLUCOSE, UA: NEGATIVE mg/dL
Hgb urine dipstick: NEGATIVE
KETONES UR: NEGATIVE mg/dL
Leukocytes, UA: NEGATIVE
NITRITE: NEGATIVE
PROTEIN: 30 mg/dL — AB
SPECIFIC GRAVITY, URINE: 1.018 (ref 1.005–1.030)
Squamous Epithelial / LPF: NONE SEEN
pH: 9 — ABNORMAL HIGH (ref 5.0–8.0)

## 2016-02-13 LAB — BASIC METABOLIC PANEL
ANION GAP: 5 (ref 5–15)
BUN: 11 mg/dL (ref 6–20)
CHLORIDE: 106 mmol/L (ref 101–111)
CO2: 29 mmol/L (ref 22–32)
CREATININE: 1.14 mg/dL (ref 0.61–1.24)
Calcium: 10.2 mg/dL (ref 8.9–10.3)
GFR calc non Af Amer: >60 mL/min (ref >60)
GLUCOSE: 97 mg/dL (ref 65–99)
Potassium: 4.1 mmol/L (ref 3.5–5.1)
Sodium: 140 mmol/L (ref 135–145)

## 2016-02-13 MED ORDER — SODIUM CHLORIDE 0.9 % IV BOLUS (SEPSIS)
1000.0000 mL | Freq: Once | INTRAVENOUS | Status: AC
  Administered 2016-02-13: 1000 mL via INTRAVENOUS

## 2016-02-13 MED ORDER — KETOROLAC TROMETHAMINE 30 MG/ML IJ SOLN
30.0000 mg | Freq: Once | INTRAMUSCULAR | Status: AC
  Administered 2016-02-13: 30 mg via INTRAVENOUS
  Filled 2016-02-13: qty 1

## 2016-02-13 MED ORDER — ONDANSETRON HCL 4 MG/2ML IJ SOLN
4.0000 mg | Freq: Once | INTRAMUSCULAR | Status: AC
  Administered 2016-02-13: 4 mg via INTRAVENOUS
  Filled 2016-02-13: qty 2

## 2016-02-13 NOTE — ED Notes (Signed)
Patient transported to CT 

## 2016-02-13 NOTE — ED Triage Notes (Signed)
Pt with left side flank pain, hx of kidney stones.

## 2016-02-13 NOTE — ED Provider Notes (Signed)
[facility] Emergency Department Provider Note  ____________________________________________  Time seen: Approximately 5:51 PM  I have reviewed the triage vital signs and the nursing notes.   HISTORY  Chief Complaint Flank Pain    HPI Terry Shaffer is a 35 y.o. male with a history of renal colic presenting with left flank pain. The patient reports he had just gotten back from [place] at 3 PM when he developed a cramp in the left lower quadrant, followed by acute onset of left flank pain.  He has had a "yucky" feeling but no true n/v.  No fevers or chills. He did have dark urine. No testicular or scrotal pain.   Past Medical History:  Diagnosis Date  . Kidney stones     There are no active problems to display for this patient.   Past Surgical History:  Procedure Laterality Date  . HERNIA REPAIR    . PARATHYROIDECTOMY    . VASECTOMY Bilateral 10/12/14    Current Outpatient Rx  . Order #: [id number] Class: Print  . Order #: [id number] Class: Print  . Order #: [id number] Class: Normal  . Order #: [id number] Class: Normal  . Order #: [id number] Class: Normal  . Order #: [id number] Class: Print  . Order #: [id number] Class: Historical Med  . Order #: [id number] Class: Print  . Order #: [id number] Class: Normal  . Order #: [id number] Class: Print  . Order #: [id number] Class: Print  . Order #: [id number] Class: Normal  . Order #: [id number] Class: Normal    Allergies Diphenhydramine and Doxycycline  Family History  Problem Relation Age of Onset  . Cancer - Colon Mother   . Breast cancer Mother   . Hypertension Father     Social History Social History  Substance Use Topics  . Smoking status: Current Every Day Smoker    Packs/day: 0.25    Types: Cigarettes  . Smokeless tobacco: Never Used     Comment: Vape  . Alcohol use No    Review of Systems Constitutional: No fever/chills.No lightheadedness or syncope. Eyes: No visual changes. ENT: No sore  throat. No congestion or rhinorrhea. Cardiovascular: Denies chest pain. Denies palpitations. Respiratory: Denies shortness of breath.  No cough. Gastrointestinal: Mild left lower quadrant cramping abdominal pain.  Positive left flank pain. No nausea, no vomiting.  No diarrhea.  No constipation. Genitourinary: Negative for dysuria. Positive dark urine. Musculoskeletal: Negative for back pain. Skin: Negative for rash. Neurological: Negative for headaches. No focal numbness, tingling or weakness.   10-point ROS otherwise negative.  ____________________________________________   PHYSICAL EXAM:  VITAL SIGNS: ED Triage Vitals  Enc Vitals Group     BP 02/13/16 1552 133/62     Pulse Rate 02/13/16 1552 88     Resp 02/13/16 1552 18     Temp 02/13/16 1552 98.2 F (36.8 C)     Temp Source 02/13/16 1552 Oral     SpO2 02/13/16 1552 97 %     Weight 02/13/16 1552 205 lb (93 kg)     Height 02/13/16 1552 5\' 11"  (1.803 m)     Head Circumference --      Peak Flow --      Pain Score 02/13/16 1607 9     Pain Loc --      Pain Edu? --      Excl. in GC? --     Constitutional: Alert and oriented. Well appearing and in no acute distress. Answers questions appropriately. Eyes: Conjunctivae are normal.  EOMI. No scleral icterus. Head: Atraumatic. Nose: No congestion/rhinnorhea. Mouth/Throat:  Mucous membranes are moist.  Neck: No stridor.  Supple.   Cardiovascular: Normal rate, regular rhythm. No murmurs, rubs or gallops.  Respiratory: Normal respiratory effort.  No accessory muscle use or retractions. Lungs CTAB.  No wheezes, rales or ronchi. Gastrointestinal: Soft, nontender and nondistended.  No guarding or rebound.  No peritoneal signs.Positive left CVA tenderness. Musculoskeletal: No LE edema.  Neurologic:  A&Ox3.  Speech is clear.  Face and smile are symmetric.  EOMI.  Moves all extremities well. Skin:  Skin is warm, dry and intact. No rash noted. Psychiatric: Mood and affect are normal.  Speech and behavior are normal.  Normal judgement.  ____________________________________________   LABS (all labs ordered are listed, but only abnormal results are displayed)  Labs Reviewed  URINALYSIS COMPLETEWITH MICROSCOPIC ([facility] ONLY) - Abnormal; Notable for the following:       Result Value   Color, Urine YELLOW (*)    APPearance CLOUDY (*)    pH 9.0 (*)    Protein, ur 30 (*)    All other components within normal limits  CBC  BASIC METABOLIC PANEL   ____________________________________________  EKG  Not indicated ____________________________________________  RADIOLOGY  Ct Renal Stone Study  Result Date: 02/13/2016 CLINICAL DATA:  Left-sided flank pain EXAM: CT ABDOMEN AND PELVIS WITHOUT CONTRAST TECHNIQUE: Multidetector CT imaging of the abdomen and pelvis was performed following the standard protocol without IV contrast. COMPARISON:  08/01/2013 FINDINGS: Lower chest: No acute abnormality. Hepatobiliary: No focal liver abnormality is seen. No gallstones, gallbladder wall thickening, or biliary dilatation. Pancreas: Unremarkable. No pancreatic ductal dilatation or surrounding inflammatory changes. Spleen: Normal in size without focal abnormality. Adrenals/Urinary Tract: Adrenal glands are unremarkable. Inferior pole calculus is noted arising from the right kidney measuring 4 mm. No right hydronephrosis or hydroureter. Several left renal calculi are noted. The largest is in the midpole measuring 5 mm, image 27 of series 2. No hydronephrosis or hydroureter. The urinary bladder is normal. Stomach/Bowel: Stomach is within normal limits. Appendix appears normal. No evidence of bowel wall thickening, distention, or inflammatory changes. Vascular/Lymphatic: No significant vascular findings are present. No enlarged abdominal or pelvic lymph nodes. Reproductive: Prostate is unremarkable. Other: Evidence of prior right inguinal hernia repair, image 72 series 2. No free fluid or fluid  collections. Musculoskeletal: No acute or significant osseous findings. IMPRESSION: 1. Bilateral nephrolithiasis. 2. No hydronephrosis, hydroureter or ureterolithiasis. Electronically Signed   By: Signa Kellaylor  Stroud M.D.   On: 02/13/2016 18:28    ____________________________________________   PROCEDURES  Procedure(s) performed: None  Procedures  Critical Care performed: No ____________________________________________   INITIAL IMPRESSION / ASSESSMENT AND PLAN / ED COURSE  Pertinent labs & imaging results that were available during my care of the patient were reviewed by me and considered in my medical decision making (see chart for details).  35 y.o. male with a history of renal colic presenting with acute onset of left flank, left lower quadrant pain with dark urine. Here, the patient is hemodynamically stable and nontoxic. He has a urinalysis which does not show any blood, but given his history and symptoms, will get a CT scan to evaluate for renal colic. I will also get basic labs. Will initiate symptomatic treatment.  ----------------------------------------- 7:04 PM on 02/13/2016 -----------------------------------------  The patient's urine does not show any blood or infection, he has a normal white blood cell count, and his creatinine is normal. His CT scan does not show any ureterolithiasis, just nephrolithiasis without any evidence of stone passage. At this time, the  patient is stable for discharge. He will follow-up with his primary care physician and understands return precautions.  ____________________________________________  FINAL CLINICAL IMPRESSION(S) / ED DIAGNOSES  Final diagnoses:  Left flank pain  Left lower quadrant pain    Clinical Course      NEW MEDICATIONS STARTED DURING THIS VISIT:  New Prescriptions   No medications on file      Rockne Menghini, MD 02/13/16 1905

## 2016-02-13 NOTE — ED Notes (Signed)
Pt. Verbalizes understanding of d/c instructions and follow-up. VS stable and pain controlled per pt.  Pt. In NAD at time of d/c and denies further concerns regarding this visit. Pt. Stable at the time of departure from the unit, departing unit by the safest and most appropriate manner per that pt condition and limitations. Pt advised to return to the ED at any time for emergent concerns, or for new/worsening symptoms.   

## 2016-02-13 NOTE — Discharge Instructions (Signed)
Please drink plenty of fluids stay well-hydrated. You may take Tylenol or Motrin for ear pain. Return to the emergency department if you develop severe pain, fever, nausea or vomiting, or any other symptoms concerning to you.

## 2016-02-28 ENCOUNTER — Emergency Department
Admission: EM | Admit: 2016-02-28 | Discharge: 2016-02-28 | Disposition: A | Discharge status: Home / Self Care | Payer: No Typology Code available for payment source | Attending: Emergency Medicine | Admitting: Emergency Medicine

## 2016-02-28 ENCOUNTER — Encounter: Payer: Self-pay | Admitting: Emergency Medicine

## 2016-02-28 ENCOUNTER — Emergency Department: Payer: No Typology Code available for payment source

## 2016-02-28 DIAGNOSIS — Y999 Unspecified external cause status: Secondary | ICD-10-CM | POA: Diagnosis not present

## 2016-02-28 DIAGNOSIS — Y939 Activity, unspecified: Secondary | ICD-10-CM | POA: Diagnosis not present

## 2016-02-28 DIAGNOSIS — S8001XA Contusion of right knee, initial encounter: Secondary | ICD-10-CM | POA: Diagnosis not present

## 2016-02-28 DIAGNOSIS — S161XXA Strain of muscle, fascia and tendon at neck level, initial encounter: Secondary | ICD-10-CM

## 2016-02-28 DIAGNOSIS — M791 Myalgia: Secondary | ICD-10-CM | POA: Diagnosis present

## 2016-02-28 DIAGNOSIS — F17290 Nicotine dependence, other tobacco product, uncomplicated: Secondary | ICD-10-CM | POA: Diagnosis not present

## 2016-02-28 DIAGNOSIS — M7918 Myalgia, other site: Secondary | ICD-10-CM

## 2016-02-28 DIAGNOSIS — Y92410 Unspecified street and highway as the place of occurrence of the external cause: Secondary | ICD-10-CM | POA: Diagnosis not present

## 2016-02-28 MED ORDER — NAPROXEN 500 MG PO TABS: 500.0000 mg | ORAL_TABLET | Freq: Two times a day (BID) | ORAL | 0 refills | Status: DC

## 2016-02-28 MED ORDER — METHOCARBAMOL 750 MG PO TABS: 750.0000 mg | ORAL_TABLET | Freq: Four times a day (QID) | ORAL | 0 refills | Status: DC
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
## 2016-03-29 ENCOUNTER — Emergency Department: Payer: BLUE CROSS/BLUE SHIELD

## 2016-03-29 ENCOUNTER — Encounter: Payer: Self-pay | Admitting: Emergency Medicine

## 2016-03-29 ENCOUNTER — Emergency Department
Admission: EM | Admit: 2016-03-29 | Discharge: 2016-03-29 | Disposition: A | Discharge status: Home / Self Care | Payer: BLUE CROSS/BLUE SHIELD | Attending: Emergency Medicine | Admitting: Emergency Medicine

## 2016-03-29 DIAGNOSIS — S39011A Strain of muscle, fascia and tendon of abdomen, initial encounter: Secondary | ICD-10-CM | POA: Diagnosis not present

## 2016-03-29 DIAGNOSIS — I861 Scrotal varices: Secondary | ICD-10-CM | POA: Insufficient documentation

## 2016-03-29 DIAGNOSIS — N200 Calculus of kidney: Secondary | ICD-10-CM

## 2016-03-29 DIAGNOSIS — Y939 Activity, unspecified: Secondary | ICD-10-CM | POA: Diagnosis not present

## 2016-03-29 DIAGNOSIS — Y929 Unspecified place or not applicable: Secondary | ICD-10-CM | POA: Insufficient documentation

## 2016-03-29 DIAGNOSIS — F17210 Nicotine dependence, cigarettes, uncomplicated: Secondary | ICD-10-CM | POA: Diagnosis not present

## 2016-03-29 DIAGNOSIS — X58XXXA Exposure to other specified factors, initial encounter: Secondary | ICD-10-CM | POA: Diagnosis not present

## 2016-03-29 DIAGNOSIS — Y999 Unspecified external cause status: Secondary | ICD-10-CM | POA: Diagnosis not present

## 2016-03-29 DIAGNOSIS — N50812 Left testicular pain: Secondary | ICD-10-CM

## 2016-03-29 DIAGNOSIS — S76219A Strain of adductor muscle, fascia and tendon of unspecified thigh, initial encounter: Secondary | ICD-10-CM

## 2016-03-29 DIAGNOSIS — S3991XA Unspecified injury of abdomen, initial encounter: Secondary | ICD-10-CM | POA: Diagnosis present

## 2016-03-29 LAB — COMPREHENSIVE METABOLIC PANEL
ALT: 11 U/L — ABNORMAL LOW (ref 17–63)
AST: 19 U/L (ref 15–41)
Albumin: 4.7 g/dL (ref 3.5–5.0)
Alkaline Phosphatase: 77 U/L (ref 38–126)
Anion gap: 7 (ref 5–15)
BUN: 13 mg/dL (ref 6–20)
CHLORIDE: 107 mmol/L (ref 101–111)
CO2: 23 mmol/L (ref 22–32)
CREATININE: 1.09 mg/dL (ref 0.61–1.24)
Calcium: 9.7 mg/dL (ref 8.9–10.3)
Glucose, Bld: 100 mg/dL — ABNORMAL HIGH (ref 65–99)
POTASSIUM: 3.9 mmol/L (ref 3.5–5.1)
Sodium: 137 mmol/L (ref 135–145)
TOTAL PROTEIN: 7.7 g/dL (ref 6.5–8.1)
Total Bilirubin: 0.7 mg/dL (ref 0.3–1.2)

## 2016-03-29 LAB — URINALYSIS COMPLETE WITH MICROSCOPIC (ARMC ONLY)
BILIRUBIN URINE: NEGATIVE
Glucose, UA: NEGATIVE mg/dL
KETONES UR: NEGATIVE mg/dL
LEUKOCYTES UA: NEGATIVE
NITRITE: NEGATIVE
PH: 8 (ref 5.0–8.0)
Protein, ur: NEGATIVE mg/dL
SPECIFIC GRAVITY, URINE: 1.015 (ref 1.005–1.030)
SQUAMOUS EPITHELIAL / LPF: NONE SEEN

## 2016-03-29 LAB — LIPASE, BLOOD: LIPASE: 29 U/L (ref 11–51)

## 2016-03-29 LAB — CBC
HEMATOCRIT: 42 % (ref 40.0–52.0)
Hemoglobin: 14.2 g/dL (ref 13.0–18.0)
MCH: 27.4 pg (ref 26.0–34.0)
MCHC: 33.7 g/dL (ref 32.0–36.0)
MCV: 81.2 fL (ref 80.0–100.0)
PLATELETS: 249 10*3/uL (ref 150–440)
RBC: 5.18 MIL/uL (ref 4.40–5.90)
RDW: 14 % (ref 11.5–14.5)
WBC: 7.9 10*3/uL (ref 3.8–10.6)

## 2016-03-29 MED ORDER — OXYCODONE-ACETAMINOPHEN 5-325 MG PO TABS: 1.0000 | ORAL_TABLET | ORAL | 0 refills | Status: DC | PRN

## 2016-03-29 MED ORDER — KETOROLAC TROMETHAMINE 30 MG/ML IJ SOLN
30.0000 mg | Freq: Once | INTRAMUSCULAR | Status: AC
  Administered 2016-03-29: 30 mg via INTRAVENOUS
  Filled 2016-03-29: qty 1

## 2016-03-29 MED ORDER — FENTANYL CITRATE (PF) 100 MCG/2ML IJ SOLN
50.0000 ug | INTRAMUSCULAR | Status: AC | PRN
  Administered 2016-03-29 (×2): 50 ug via INTRAVENOUS
  Filled 2016-03-29: qty 2

## 2016-03-29 NOTE — Discharge Instructions (Signed)
Please schedule an appointment to see urology on Monday
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
## 2016-03-29 NOTE — ED Triage Notes (Signed)
Patient presents to the ED with left lower quadrant pain and left groin pain since 10am this morning.  Patient states pain has come very suddenly.  Patient denies back pain.  Patient reports history of kidney stones.  Patient denies difficulty with urination.

## 2016-03-31 ENCOUNTER — Ambulatory Visit
Admission: EM | Admit: 2016-03-31 | Discharge: 2016-03-31 | Disposition: A | Discharge status: Home / Self Care | Payer: BLUE CROSS/BLUE SHIELD | Attending: Family Medicine | Admitting: Family Medicine

## 2016-03-31 DIAGNOSIS — N451 Epididymitis: Secondary | ICD-10-CM

## 2016-03-31 MED ORDER — LEVOFLOXACIN 500 MG PO TABS: 500.0000 mg | ORAL_TABLET | Freq: Every day | ORAL | 0 refills | Status: DC

## 2016-03-31 NOTE — ED Provider Notes (Signed)
[facility]    CSN: [id number] Arrival date & time: 03/31/16  0848     History   Chief Complaint Chief Complaint  Patient presents with  . Testicle Pain    left    HPI Terry Shaffer is a 35 y.o. male.   35 yo male with a 3 days h/o left testicle pain, sudden onset of ache. Patient was seen 3 days ago, the day symptoms started, in the ED and had scrotal ultrasound, CT scan and renal CT, all essentially negative except for a small varicocele. Patient states pain has not improved. Denies any trauma, fever, chills, vomiting, nausea, penile discharge, dysuria, hematuria.    The history is provided by the patient.  Testicle Pain     Past Medical History:  Diagnosis Date  . Kidney stones     There are no active problems to display for this patient.   Past Surgical History:  Procedure Laterality Date  . HERNIA REPAIR    . PARATHYROIDECTOMY    . VASECTOMY Bilateral 10/12/14       Home Medications    Prior to Admission medications   Medication Sig Start Date End Date Taking? Authorizing Provider  oxyCODONE-acetaminophen (ROXICET) 5-325 MG tablet Take 1 tablet by mouth every 4 (four) hours as needed for severe pain. 03/29/16  Yes Jami L Hagler, PA-C  levofloxacin (LEVAQUIN) 500 MG tablet Take 1 tablet (500 mg total) by mouth daily. 03/31/16   Payton Mccallumrlando Carrissa Taitano, MD    Family History Family History  Problem Relation Age of Onset  . Cancer - Colon Mother   . Breast cancer Mother   . Hypertension Father     Social History Social History  Substance Use Topics  . Smoking status: Current Every Day Smoker    Packs/day: 0.25    Types: E-cigarettes  . Smokeless tobacco: Never Used     Comment: Vape  . Alcohol use No     Allergies   Diphenhydramine and Doxycycline   Review of Systems Review of Systems  Genitourinary: Positive for testicular pain.     Physical Exam Triage Vital Signs ED Triage Vitals  Enc Vitals Group     BP 03/31/16 0922  128/75     Pulse Rate 03/31/16 0922 71     Resp 03/31/16 0922 18     Temp 03/31/16 0922 98.2 F (36.8 C)     Temp Source 03/31/16 0922 Oral     SpO2 03/31/16 0922 100 %     Weight 03/31/16 0923 210 lb (95.3 kg)     Height 03/31/16 0923 5\' 11"  (1.803 m)     Head Circumference --      Peak Flow --      Pain Score 03/31/16 0924 10     Pain Loc --      Pain Edu? --      Excl. in GC? --    No data found.   Updated Vital Signs BP 128/75 (BP Location: Left Arm)   Pulse 71   Temp 98.2 F (36.8 C) (Oral)   Resp 18   Ht 5\' 11"  (1.803 m)   Wt 210 lb (95.3 kg)   SpO2 100%   BMI 29.29 kg/m   Visual Acuity Right Eye Distance:   Left Eye Distance:   Bilateral Distance:    Right Eye Near:   Left Eye Near:    Bilateral Near:     Physical Exam  Constitutional: He appears well-developed and well-nourished. No distress.  Genitourinary: Penis normal. Left testis shows tenderness (over left epididymis).  Skin: He is not diaphoretic.  Nursing note and vitals reviewed.    UC Treatments / Results  Labs (all labs ordered are listed, but only abnormal results are displayed) Labs Reviewed - No data to display  EKG  EKG Interpretation None       Radiology US Scrotum  Result Date: 03/29/2016 CLINICAL DATA:  Patient with sudden onset left testicular pain. EXAM: SCROTAL ULTRASOUND DOPPLER ULTRASOUND OF THE TESTICLES TECHNIQUE: Complete ultrasound examination of the testicles, epididymis, and other scrotal structures was performed. Color and spectral Doppler ultrasound were also utilized to evaluate blood flow to the testicles. COMPARISON:  CT abdomen pelvis 02/13/2016. FINDINGS: Right testicle Measurements: 4.8 x 2.1 x 2.4 cm. No mass or microlithiasis visualized. Left testicle Measurements: 4.4 x 2.3 x 2.8 cm. No mass or microlithiasis visualized. Right epididymis:  Normal in size and appearance. Left epididymis:  Normal in size and appearance. Hydrocele:  None visualized.  Varicocele:  Small left varicocele. Pulsed Doppler interrogation of both testes demonstrates normal low resistance arterial and venous waveforms bilaterally. IMPRESSION: No sonographic evidence to suggest torsion. Small left varicocele. Electronically Signed   By: Annia Belt M.D.   On: 03/29/2016 13:58   [place] Art/ven Flow Abd Pelv Doppler  Result Date: 03/29/2016 CLINICAL DATA:  Patient with sudden onset left testicular pain. EXAM: SCROTAL ULTRASOUND DOPPLER ULTRASOUND OF THE TESTICLES TECHNIQUE: Complete ultrasound examination of the testicles, epididymis, and other scrotal structures was performed. Color and spectral Doppler ultrasound were also utilized to evaluate blood flow to the testicles. COMPARISON:  CT abdomen pelvis 02/13/2016. FINDINGS: Right testicle Measurements: 4.8 x 2.1 x 2.4 cm. No mass or microlithiasis visualized. Left testicle Measurements: 4.4 x 2.3 x 2.8 cm. No mass or microlithiasis visualized. Right epididymis:  Normal in size and appearance. Left epididymis:  Normal in size and appearance. Hydrocele:  None visualized. Varicocele:  Small left varicocele. Pulsed Doppler interrogation of both testes demonstrates normal low resistance arterial and venous waveforms bilaterally. IMPRESSION: No sonographic evidence to suggest torsion. Small left varicocele. Electronically Signed   By: Annia Belt M.D.   On: 03/29/2016 13:58   Ct Renal Stone Study  Result Date: 03/29/2016 CLINICAL DATA:  Patient with left groin pain and hematuria. EXAM: CT ABDOMEN AND PELVIS WITHOUT CONTRAST TECHNIQUE: Multidetector CT imaging of the abdomen and pelvis was performed following the standard protocol without IV contrast. COMPARISON:  CT abdomen pelvis 02/13/2016. FINDINGS: Lower chest: Normal heart size. Minimal atelectasis within the left greater than right lower lobes. No pleural effusion. Hepatobiliary: Liver is normal in size and contour. Gallbladder is unremarkable. Pancreas: Unremarkable Spleen:  Unremarkable Adrenals/Urinary Tract: Normal adrenal glands. There is a 3 mm nonobstructing stone within the inferior pole of the right kidney. Within the inferior pole left kidney and there is a 2 mm nonobstructing stone. Additionally within the interpolar region of the left kidney there is a 6 mm nonobstructing stone. No hydronephrosis. Urinary bladder is unremarkable. Stomach/Bowel: No abnormal bowel wall thickening or evidence for obstruction. Normal morphology of the stomach. No free fluid or free intraperitoneal air. Normal appendix. Vascular/Lymphatic: Normal caliber abdominal aorta. No retroperitoneal lymphadenopathy. Reproductive: Prostate is unremarkable. Other: Post mesh repair within the right lower anterior abdominal wall. Musculoskeletal: No aggressive or acute appearing osseous lesions. IMPRESSION: No acute process within the abdomen or pelvis. Nonobstructing stones within the kidneys bilaterally. Electronically Signed   By: Annia Belt M.D.   On: 03/29/2016 17:23  Procedures Procedures (including critical care time)  Medications Ordered in UC Medications - No data to display   Initial Impression / Assessment and Plan / UC Course  I have reviewed the triage vital signs and the nursing notes.  Pertinent labs & imaging results that were available during my care of the patient were reviewed by me and considered in my medical decision making (see chart for details).  Clinical Course       Final Clinical Impressions(s) / UC Diagnoses   Final diagnoses:  Epididymitis    New Prescriptions Discharge Medication List as of 03/31/2016  9:56 AM    START taking these medications   Details  levofloxacin (LEVAQUIN) 500 MG tablet Take 1 tablet (500 mg total) by mouth daily., Starting Mon 03/31/2016, Normal       1. diagnosis reviewed with patient 2. rx as per orders above; reviewed possible side effects, interactions, risks and benefits  3. Recommend supportive treatment with otc  analgesics, support 4. Follow-up prn if symptoms worsen or don't improve   Payton Mccallumrlando Alexandria Shiflett, MD 03/31/16 1149

## 2016-03-31 NOTE — ED Triage Notes (Signed)
Pt c/o left testicle pain, he was [facility] and they did a ultrasound and CT the dx was a swollen blood vessel and they told him to keep ice on it and gave him a medication. He says he has tried the ice and the medication but it isnt getting any better. He is nausea and it hurts really bad it feels like someone is hitting him there. He had a vasectomy last May and there was trauma with that surgery with a blood clot and a bleed.

## 2016-04-03 ENCOUNTER — Telehealth: Payer: Self-pay | Admitting: *Deleted

## 2016-04-03 NOTE — Telephone Encounter (Signed)
Courtesy call back, verified DOB, patient reported feeling much better. Advised patient to follow up with PCP or [facility] if symptoms return. 

## 2016-04-27 ENCOUNTER — Ambulatory Visit
Admission: EM | Admit: 2016-04-27 | Discharge: 2016-04-27 | Disposition: A | Discharge status: Home / Self Care | Payer: BLUE CROSS/BLUE SHIELD | Attending: Family Medicine | Admitting: Family Medicine

## 2016-04-27 ENCOUNTER — Encounter: Payer: Self-pay | Admitting: Gynecology

## 2016-04-27 DIAGNOSIS — J019 Acute sinusitis, unspecified: Secondary | ICD-10-CM | POA: Diagnosis not present

## 2016-04-27 DIAGNOSIS — R69 Illness, unspecified: Secondary | ICD-10-CM

## 2016-04-27 DIAGNOSIS — J111 Influenza due to unidentified influenza virus with other respiratory manifestations: Secondary | ICD-10-CM

## 2016-04-27 DIAGNOSIS — J069 Acute upper respiratory infection, unspecified: Secondary | ICD-10-CM

## 2016-04-27 LAB — RAPID STREP SCREEN (MED CTR MEBANE ONLY): STREPTOCOCCUS, GROUP A SCREEN (DIRECT): NEGATIVE

## 2016-04-27 LAB — RAPID INFLUENZA A&B ANTIGENS: Influenza A (ARMC): NEGATIVE

## 2016-04-27 LAB — RAPID INFLUENZA A&B ANTIGENS (ARMC ONLY): INFLUENZA B (ARMC): NEGATIVE

## 2016-04-27 MED ORDER — FEXOFENADINE-PSEUDOEPHED ER 180-240 MG PO TB24
1.0000 | ORAL_TABLET | Freq: Every day | ORAL | 0 refills | Status: DC
Start: 2016-04-27 — End: 2016-05-07

## 2016-04-27 MED ORDER — AMOXICILLIN-POT CLAVULANATE 875-125 MG PO TABS: 1.0000 | ORAL_TABLET | Freq: Two times a day (BID) | ORAL | 0 refills | Status: DC

## 2016-04-27 MED ORDER — HYDROCOD POLST-CPM POLST ER 10-8 MG/5ML PO SUER: 5.0000 mL | Freq: Two times a day (BID) | ORAL | 0 refills | Status: DC | PRN

## 2016-04-27 NOTE — ED Triage Notes (Signed)
Per patient cough / sore throat / chills x 1 week.

## 2016-04-27 NOTE — ED Provider Notes (Addendum)
[facility]    CSN: [id number] Arrival date & time: 04/27/16  1310     History   Chief Complaint Chief Complaint  Patient presents with  . Sore Throat  . Cough    HPI Terry Shaffer is a 35 y.o. male.   Patient reports having nasal congestion sore throat started last week Sunday. States he was late getting to work Monday and Tuesday he felt so bad. The symptoms and and was progressed. His cough now around-the-clock states she's having difficult sleeping at all. Reports increased sinus pressure and nasal congestion and the achiness and myalgias had all his bodies now more constipated along his face. He's been having chills and the shakes as well He states on Thursday still also became very withdrawn very irritated as well. He is allergic to doxycycline and Benadryl. He said history kidney stones kidney repair parathyroidectomy vasectomy with complications. He does not smoke anymore but he still vapes. No pertinent past family over history relevant to today's visit. Warned he needs to stop vaping   The history is provided by the patient. No language interpreter was used.  Sore Throat  This is a new problem. The current episode started more than 1 week ago. The problem occurs constantly. The problem has been gradually worsening. Associated symptoms include headaches. Pertinent negatives include no chest pain, no abdominal pain and no shortness of breath. Nothing aggravates the symptoms. He has tried acetaminophen for the symptoms. The treatment provided no relief.  Cough  Associated symptoms: chills, headaches and sore throat   Associated symptoms: no chest pain and no shortness of breath     Past Medical History:  Diagnosis Date  . Kidney stones     There are no active problems to display for this patient.   Past Surgical History:  Procedure Laterality Date  . HERNIA REPAIR    . PARATHYROIDECTOMY    . VASECTOMY Bilateral 10/12/14       Home Medications     Prior to Admission medications   Medication Sig Start Date End Date Taking? Authorizing Provider  amoxicillin-clavulanate (AUGMENTIN) 875-125 MG tablet Take 1 tablet by mouth 2 (two) times daily. 04/27/16   Hassan RowanEugene Vincent Streater, MD  chlorpheniramine-HYDROcodone [facility](TUSSIONEX PENNKINETIC ER) 10-8 MG/5ML SUER Take 5 mLs by mouth every 12 (twelve) hours as needed for cough. 04/27/16   Hassan RowanEugene Josephyne Tarter, MD  fexofenadine-pseudoephedrine (ALLEGRA-D ALLERGY & CONGESTION) 180-240 MG 24 hr tablet Take 1 tablet by mouth daily. 04/27/16   Hassan RowanEugene Yaw Escoto, MD  levofloxacin (LEVAQUIN) 500 MG tablet Take 1 tablet (500 mg total) by mouth daily. 03/31/16   Payton Mccallumrlando Conty, MD  oxyCODONE-acetaminophen (ROXICET) 5-325 MG tablet Take 1 tablet by mouth every 4 (four) hours as needed for severe pain. 03/29/16   Jami L Hagler, PA-C    Family History Family History  Problem Relation Age of Onset  . Cancer - Colon Mother   . Breast cancer Mother   . Hypertension Father     Social History Social History  Substance Use Topics  . Smoking status: Current Every Day Smoker    Packs/day: 0.25    Types: E-cigarettes  . Smokeless tobacco: Never Used     Comment: Vape  . Alcohol use No     Allergies   Diphenhydramine and Doxycycline   Review of Systems Review of Systems  Constitutional: Positive for chills.  HENT: Positive for facial swelling, sinus pain, sinus pressure and sore throat.   Respiratory: Positive for cough. Negative for shortness of  breath.   Cardiovascular: Negative for chest pain.  Gastrointestinal: Negative for abdominal pain.  Neurological: Positive for headaches.  All other systems reviewed and are negative.    Physical Exam Triage Vital Signs ED Triage Vitals  Enc Vitals Group     BP 04/27/16 1423 133/90     Pulse Rate 04/27/16 1423 74     Resp 04/27/16 1423 16     Temp 04/27/16 1423 98.1 F (36.7 C)     Temp Source 04/27/16 1423 Oral     SpO2 04/27/16 1423 100 %     Weight 04/27/16 1425 210  lb (95.3 kg)     Height --      Head Circumference --      Peak Flow --      Pain Score 04/27/16 1426 8     Pain Loc --      Pain Edu? --      Excl. in GC? --    No data found.   Updated Vital Signs BP 133/90 (BP Location: Left Arm)   Pulse 74   Temp 98.1 F (36.7 C) (Oral)   Resp 16   Wt 210 lb (95.3 kg)   SpO2 100%   BMI 29.29 kg/m   Visual Acuity Right Eye Distance:   Left Eye Distance:   Bilateral Distance:    Right Eye Near:   Left Eye Near:    Bilateral Near:     Physical Exam  Constitutional: He is oriented to person, place, and time. Vital signs are normal. He appears ill. No distress.  HENT:  Head: Normocephalic and atraumatic.  Right Ear: Hearing, tympanic membrane, external ear and ear canal normal.  Left Ear: Hearing, tympanic membrane, external ear and ear canal normal.  Nose: Mucosal edema present. No rhinorrhea. Right sinus exhibits maxillary sinus tenderness and frontal sinus tenderness. Left sinus exhibits maxillary sinus tenderness and frontal sinus tenderness.  Eyes: Conjunctivae and EOM are normal. Pupils are equal, round, and reactive to light.  Neck: Normal range of motion. Neck supple.  Cardiovascular: Normal rate.   Pulmonary/Chest: Effort normal and breath sounds normal.  Musculoskeletal: Normal range of motion.  Lymphadenopathy:    He has cervical adenopathy.  Neurological: He is alert and oriented to person, place, and time.  Skin: Skin is warm and dry. He is not diaphoretic.  Psychiatric: He has a normal mood and affect.  Vitals reviewed.    UC Treatments / Results  Labs (all labs ordered are listed, but only abnormal results are displayed) Labs Reviewed  RAPID INFLUENZA A&B ANTIGENS ([facility] ONLY)  RAPID STREP SCREEN (NOT AT [facility])  CULTURE, GROUP A STREP [facility])    EKG  EKG Interpretation None       Radiology No results found.  Procedures Procedures (including critical care time)  Medications Ordered in  UC Medications - No data to display   Initial Impression / Assessment and Plan / UC Course  I have reviewed the triage vital signs and the nursing notes.  Pertinent labs & imaging results that were available during my care of the patient were reviewed by me and considered in my medical decision making (see chart for details).   Results for orders placed or performed during the hospital encounter of 04/27/16  Rapid Influenza A&B Antigens ([facility] only)  Result Value Ref Range   Influenza A ([facility]) NEGATIVE NEGATIVE   Influenza B ([facility]) NEGATIVE NEGATIVE  Rapid strep screen  Result Value Ref Range   Streptococcus, Group A  Screen (Direct) NEGATIVE NEGATIVE   Clinical Course    Patient strep test flu test both negative but because of duration of illness over a week now and just the way he looks slightly he probably has a secondary infection for bowel infection or from the flu treated for sinus infection and place him on Augmentin 875 1 tablet twice daily for 10 days Tussionex 1 teaspoon twice a day and Allegra-D 1 tablet daily. Will give a work note for Sunday and Monday as well   Final Clinical Impressions(s) / UC Diagnoses   Final diagnoses:  Upper respiratory tract infection, unspecified type  Acute non-recurrent sinusitis, unspecified location  Influenza-like illness    New Prescriptions Discharge Medication List as of 04/27/2016  3:15 PM    START taking these medications   Details  amoxicillin-clavulanate (AUGMENTIN) 875-125 MG tablet Take 1 tablet by mouth 2 (two) times daily., Starting Sun 04/27/2016, Normal    chlorpheniramine-HYDROcodone (TUSSIONEX PENNKINETIC ER) 10-8 MG/5ML SUER Take 5 mLs by mouth every 12 (twelve) hours as needed for cough., Starting Sun 04/27/2016, Normal    fexofenadine-pseudoephedrine (ALLEGRA-D ALLERGY & CONGESTION) 180-240 MG 24 hr tablet Take 1 tablet by mouth daily., Starting Sun 04/27/2016, Normal         Hassan Rowan, MD 04/27/16 2110     Hassan Rowan, MD 04/27/16 2110

## 2016-04-30 ENCOUNTER — Telehealth: Payer: Self-pay | Admitting: *Deleted

## 2016-04-30 LAB — CULTURE, GROUP A STREP (THRC)

## 2016-04-30 NOTE — Telephone Encounter (Signed)
Patient returned phone message, verified DOB, communicated positive for strep, culture result. Patient reported some improvement, but still having cough and sore throat symptoms. Advised patient to finish antibiotic treatment and follow up with PCP or [facility] if symptoms persist

## 2016-05-07 ENCOUNTER — Ambulatory Visit
Admission: EM | Admit: 2016-05-07 | Discharge: 2016-05-07 | Disposition: A | Discharge status: Home / Self Care | Payer: BLUE CROSS/BLUE SHIELD | Attending: Emergency Medicine | Admitting: Emergency Medicine

## 2016-05-07 ENCOUNTER — Encounter: Payer: Self-pay | Admitting: Gynecology

## 2016-05-07 DIAGNOSIS — Z23 Encounter for immunization: Secondary | ICD-10-CM

## 2016-05-07 DIAGNOSIS — S51812A Laceration without foreign body of left forearm, initial encounter: Secondary | ICD-10-CM

## 2016-05-07 MED ORDER — IBUPROFEN 800 MG PO TABS: 800.0000 mg | ORAL_TABLET | Freq: Three times a day (TID) | ORAL | 0 refills | Status: DC

## 2016-05-07 MED ORDER — TETANUS-DIPHTH-ACELL PERTUSSIS 5-2.5-18.5 LF-MCG/0.5 IM SUSP
0.5000 mL | Freq: Once | INTRAMUSCULAR | Status: AC
  Administered 2016-05-07: 0.5 mL via INTRAMUSCULAR

## 2016-05-07 NOTE — ED Triage Notes (Signed)
Laceration on left hand with a box cutter at home today.

## 2016-05-07 NOTE — ED Provider Notes (Signed)
HPI  SUBJECTIVE:  Terry MilchBryan Anthony Shaffer is a right handed 35 y.o. male who presents with a laceration to his left forearm. States that he accidentally cut himself today. He reports constant, throbbing achy pain. Denies numbness, tingling, weakness, foreign body sensation. He is able to move his fingers freely. There are no aggravating or alleviating factors. He put a towel over the cut and came immediately here. Past medical history negative for diabetes, hypertension. Tetanus unknown. PMD: None.    Past Medical History:  Diagnosis Date  . Kidney stones     Past Surgical History:  Procedure Laterality Date  . HERNIA REPAIR    . PARATHYROIDECTOMY    . VASECTOMY Bilateral 10/12/14    Family History  Problem Relation Age of Onset  . Cancer - Colon Mother   . Breast cancer Mother   . Hypertension Father     Social History  Substance Use Topics  . Smoking status: Current Every Day Smoker    Packs/day: 0.25    Types: E-cigarettes  . Smokeless tobacco: Never Used     Comment: Vape  . Alcohol use No    No current facility-administered medications for this encounter.   Current Outpatient Prescriptions:  .  chlorpheniramine-HYDROcodone (TUSSIONEX PENNKINETIC ER) 10-8 MG/5ML SUER, Take 5 mLs by mouth every 12 (twelve) hours as needed for cough., Disp: 115 mL, Rfl: 0 .  ibuprofen (ADVIL,MOTRIN) 800 MG tablet, Take 1 tablet (800 mg total) by mouth 3 (three) times daily., Disp: 30 tablet, Rfl: 0  Allergies  Allergen Reactions  . Diphenhydramine Swelling  . Doxycycline Other (See Comments)    Reaction: Jittery     ROS  As noted in HPI.   Physical Exam  BP (!) 149/89 (BP Location: Left Arm)   Pulse 100   Temp 98.1 F (36.7 C) (Oral)   Resp 16   Wt 210 lb (95.3 kg)   SpO2 100%   BMI 29.29 kg/m   Constitutional: Well developed, well nourished, no acute distress Eyes:  EOMI, conjunctiva normal bilaterally HENT: Normocephalic, atraumatic,mucus membranes  moist Respiratory: Normal inspiratory effort Cardiovascular: Normal rate GI: nondistended skin: Clean linear 3 cm laceration left forearm. Patient's sensation and motor in the median/radial/ulnar distribution intact distally. Musculoskeletal: no deformities Neurologic: Alert & oriented x 3, no focal neuro deficits Psychiatric: Speech and behavior appropriate   ED Course   Medications  Tdap (BOOSTRIX) injection 0.5 mL (0.5 mLs Intramuscular Given 05/07/16 1728)    No orders of the defined types were placed in this encounter.   No results found for this or any previous visit (from the past 24 hour(s)). No results found.  ED Clinical Impression  Laceration of left forearm, initial encounter   ED Assessment/Plan  Updated tetanus.  Had patient extensively irrigate and scrub wound out with chlorhexidine and Water. explored wound with adequate hemostasis, no tendon, nerve involvement noted. No foreign body noted. Used 1 cc of 1% plain lidocaine local infiltration with adequate anesthesia. Placed 4 4-0 Ethilon sutures. Bacitracin then dressing placed. Patient tolerated procedure well.  Patient to return here in 10 days for suture removal. He is to apply bacitracin on the wound as it heals. He will return sooner for any signs of infection. Do not see need for prophylactic antibiotics. We'll prescribe ibuprofen 800 mg 1 g of Tylenol 3 times a day as needed for pain. We'll also provide [facility].   Meds ordered this encounter  Medications  . Tdap (  BOOSTRIX) injection 0.5 mL  . ibuprofen (ADVIL,MOTRIN) 800 MG tablet    Sig: Take 1 tablet (800 mg total) by mouth 3 (three) times daily.    Dispense:  30 tablet    Refill:  0    *This clinic note was created using Scientist, clinical (histocompatibility and immunogenetics)Dragon dictation software. Therefore, there may be occasional mistakes despite careful proofreading.  ?   Domenick GongAshley Yoshua Geisinger, MD 05/07/16 [phone]

## 2016-06-17 ENCOUNTER — Ambulatory Visit
Admission: EM | Admit: 2016-06-17 | Discharge: 2016-06-17 | Disposition: A | Discharge status: Home / Self Care | Payer: BLUE CROSS/BLUE SHIELD | Attending: Family Medicine | Admitting: Family Medicine

## 2016-06-17 DIAGNOSIS — R69 Illness, unspecified: Secondary | ICD-10-CM | POA: Diagnosis not present

## 2016-06-17 DIAGNOSIS — J111 Influenza due to unidentified influenza virus with other respiratory manifestations: Secondary | ICD-10-CM

## 2016-06-17 LAB — RAPID STREP SCREEN (MED CTR MEBANE ONLY): Streptococcus, Group A Screen (Direct): NEGATIVE

## 2016-06-17 MED ORDER — HYDROCOD POLST-CPM POLST ER 10-8 MG/5ML PO SUER: 5.0000 mL | Freq: Every evening | ORAL | 0 refills | Status: DC | PRN

## 2016-06-17 MED ORDER — OSELTAMIVIR PHOSPHATE 75 MG PO CAPS: 75.0000 mg | ORAL_CAPSULE | Freq: Two times a day (BID) | ORAL | 0 refills | Status: DC

## 2016-06-17 NOTE — ED Triage Notes (Signed)
Patient complains of cough, congestion, body aches and fatigue. Patient states that symptoms started on Sunday and have been worsening since onset.

## 2016-06-17 NOTE — ED Provider Notes (Signed)
[facility] ____________________________________________  Time seen: Approximately 1:30 PM  I have reviewed the triage vital signs and the nursing notes.   HISTORY  Chief Complaint Cough  HPI Terry Shaffer is a 36 y.o. male present for the complaints of 2 days of runny nose, nasal congestion, cough and chills, body aches. Patient reports some mild sore throat. Patient reports started Sunday night with some nasal congestion and gradually worsened. Patient denies known fevers at home, but reports has felt like he has had a fever. Reports has been taken intermittent over-the-counter medication including DayQuil and Sudafed with some improvement. Reports continues to drink fluids well but slight decrease in appetite. Patient reports cough and multiple times this morning having one episode of vomiting. Denies other episodes of vomiting. Denies diarrhea. Denies accompanying abdominal pain.  States cough is primarily a dry hacking cough. States cough intermittently waking him up at night.  Denies chest pain, shortness of breath, abdominal pain, dysuria, extremity pain, extremity swelling or rash. Denies recent sickness. Denies recent antibiotic use. Denies cardiac history. Denies renal insufficiency.   Past Medical History:  Diagnosis Date  . Kidney stones     There are no active problems to display for this patient.   Past Surgical History:  Procedure Laterality Date  . HERNIA REPAIR    . PARATHYROIDECTOMY    . VASECTOMY Bilateral 10/12/14     No current facility-administered medications for this encounter.   Current Outpatient Prescriptions:  .  chlorpheniramine-HYDROcodone (TUSSIONEX PENNKINETIC ER) 10-8 MG/5ML SUER, Take 5 mLs by mouth at bedtime as needed for cough. do not drive or operate machinery while taking as can cause drowsiness., Disp: 75 mL, Rfl: 0 .  ibuprofen (ADVIL,MOTRIN) 800 MG tablet, Take 1 tablet (800 mg total) by mouth 3 (three) times daily.,  Disp: 30 tablet, Rfl: 0 .  oseltamivir (TAMIFLU) 75 MG capsule, Take 1 capsule (75 mg total) by mouth every 12 (twelve) hours., Disp: 10 capsule, Rfl: 0  Allergies Diphenhydramine and Doxycycline  Family History  Problem Relation Age of Onset  . Cancer - Colon Mother   . Breast cancer Mother   . Hypertension Father     Social History Social History  Substance Use Topics  . Smoking status: Current Every Day Smoker    Packs/day: 0.25    Types: E-cigarettes  . Smokeless tobacco: Never Used     Comment: Vape  . Alcohol use No    Review of Systems Constitutional: As above. Eyes: No visual changes. ENT:  As above. Cardiovascular: Denies chest pain. Respiratory: Denies shortness of breath. Gastrointestinal: No abdominal pain.  No diarrhea.  No constipation. Genitourinary: Negative for dysuria. Musculoskeletal: Negative for back pain. Skin: Negative for rash. Neurological: Negative for headaches, focal weakness or numbness.  10-point ROS otherwise negative.  ____________________________________________   PHYSICAL EXAM:  VITAL SIGNS: ED Triage Vitals  Enc Vitals Group     BP 06/17/16 1251 127/77     Pulse Rate 06/17/16 1251 60     Resp 06/17/16 1251 17     Temp 06/17/16 1251 97.7 F (36.5 C)     Temp Source 06/17/16 1251 Oral     SpO2 06/17/16 1251 97 %     Weight 06/17/16 1250 211 lb (95.7 kg)     Height 06/17/16 1250 5\' 11"  (1.803 m)     Head Circumference --      Peak Flow --      Pain Score 06/17/16 1251 8  Pain Loc --      Pain Edu? --      Excl. in GC? --     Constitutional: Alert and oriented. Well appearing and in no acute distress. Eyes: Conjunctivae are normal. PERRL. EOMI. Head: Atraumatic. No sinus tenderness to palpation. No swelling. No erythema.  Ears: no erythema, normal TMs bilaterally.   Nose:Nasal congestion with clear rhinorrhea  Mouth/Throat: Mucous membranes are moist. Mild pharyngeal erythema. No tonsillar swelling or exudate.    Neck: No stridor.  No cervical spine tenderness to palpation. Hematological/Lymphatic/Immunilogical: No cervical lymphadenopathy. Cardiovascular: Normal rate, regular rhythm. Grossly normal heart sounds.  Good peripheral circulation. Respiratory: Normal respiratory effort.  No retractions. No wheezes, rales or rhonchi. Good air movement.  Gastrointestinal: Soft and nontender. No CVA tenderness. Musculoskeletal: Ambulatory with steady gait. No cervical, thoracic or lumbar tenderness to palpation. Neurologic:  Normal speech and language. No gait instability. Skin:  Skin appears warm, dry and intact. No rash noted. Psychiatric: Mood and affect are normal. Speech and behavior are normal. ___________________________________________   LABS (all labs ordered are listed, but only abnormal results are displayed)  Labs Reviewed  RAPID STREP SCREEN (NOT AT [facility])   ____________________________________________   PROCEDURES Procedures   INITIAL IMPRESSION / ASSESSMENT AND PLAN / ED COURSE  Pertinent labs & imaging results that were available during my care of the patient were reviewed by me and considered in my medical decision making (see chart for details).  Well-appearing patient. No acute distress. Quick strep negative, will culture. Suspect influenza. Discussed evaluation and treatment options with patient. Will treat patient with oral Tamiflu, when necessary Tussionex, and patient reports will take home cough congestion medications during the daytime as needed. Encourage rest, fluids and supportive care. Work note given for today and tomorrow.Discussed indication, risks and benefits of medications with patient.  Discussed follow up with Primary care physician this week. Discussed follow up and return parameters including no resolution or any worsening concerns. Patient verbalized understanding and agreed to plan.   ____________________________________________   FINAL CLINICAL  IMPRESSION(S) / ED DIAGNOSES  Final diagnoses:  Influenza-like illness     Discharge Medication List as of 06/17/2016  1:24 PM    START taking these medications   Details  oseltamivir (TAMIFLU) 75 MG capsule Take 1 capsule (75 mg total) by mouth every 12 (twelve) hours., Starting Tue 06/17/2016, Normal        Note: This dictation was prepared with Dragon dictation along with smaller phrase technology. Any transcriptional errors that result from this process are unintentional.         Renford Dills, NP 06/17/16 1334    Renford Dills, NP 06/17/16 1337

## 2016-06-17 NOTE — Discharge Instructions (Signed)
Take medication as prescribed. Rest. Drink plenty of fluids.  ° °Follow up with your primary care physician this week as needed. Return to Urgent care for new or worsening concerns.  ° °

## 2016-06-20 LAB — CULTURE, GROUP A STREP (THRC)

## 2016-07-13 ENCOUNTER — Ambulatory Visit
Admission: EM | Admit: 2016-07-13 | Discharge: 2016-07-13 | Disposition: A | Discharge status: Home / Self Care | Payer: BLUE CROSS/BLUE SHIELD | Attending: Family Medicine | Admitting: Family Medicine

## 2016-07-13 DIAGNOSIS — M273 Alveolitis of jaws: Secondary | ICD-10-CM | POA: Diagnosis not present

## 2016-07-13 DIAGNOSIS — K0889 Other specified disorders of teeth and supporting structures: Secondary | ICD-10-CM | POA: Diagnosis not present

## 2016-07-13 MED ORDER — ACETAMINOPHEN 500 MG PO TABS
500.0000 mg | ORAL_TABLET | Freq: Once | ORAL | Status: AC
  Administered 2016-07-13: 500 mg via ORAL

## 2016-07-13 MED ORDER — IBUPROFEN 200 MG PO TABS: 200.0000 mg | ORAL_TABLET | Freq: Once | ORAL | Status: DC

## 2016-07-13 MED ORDER — AMOXICILLIN-POT CLAVULANATE 875-125 MG PO TABS: 1.0000 | ORAL_TABLET | Freq: Two times a day (BID) | ORAL | 0 refills | Status: DC

## 2016-07-13 MED ORDER — IBUPROFEN 400 MG PO TABS
400.0000 mg | ORAL_TABLET | Freq: Once | ORAL | Status: AC
  Administered 2016-07-13: 400 mg via ORAL

## 2016-07-13 NOTE — ED Provider Notes (Signed)
CSN: [id number]     Arrival date & time 07/13/16  1521 History   First MD Initiated Contact with Patient 07/13/16 1646     Chief Complaint  Patient presents with  . Dental Pain    Wisdom Teeth Removal   (Consider location/radiation/quality/duration/timing/severity/associated sxs/prior Treatment) HPI  36 year old male who had right upper dental pain after wisdom tooth extraction  about a week ago. Since That time he's been having  severe pain. Complaining of  Bad bitter taste in his mouth since this has happened. Placed call to the oral surgeon today but they haven't returned his call.. Having  severe pain that is not being controlled with his hydrocodone or ibuprofen 800 mg. Also using ice. Had no fever or chills.Placed on Clindamycin by surgeon.      Past Medical History:  Diagnosis Date  . Kidney stones    Past Surgical History:  Procedure Laterality Date  . HERNIA REPAIR    . PARATHYROIDECTOMY    . VASECTOMY Bilateral 10/12/14   Family History  Problem Relation Age of Onset  . Cancer - Colon Mother   . Breast cancer Mother   . Hypertension Father    Social History  Substance Use Topics  . Smoking status: Current Every Day Smoker    Packs/day: 0.25    Types: E-cigarettes  . Smokeless tobacco: Never Used     Comment: Vape  . Alcohol use No    Review of Systems  Constitutional: Positive for activity change. Negative for chills and fever.  HENT: Positive for dental problem.   All other systems reviewed and are negative.   Allergies  Diphenhydramine and Doxycycline  Home Medications   Prior to Admission medications   Medication Sig Start Date End Date Taking? Authorizing Provider  HYDROcodone-acetaminophen (NORCO/VICODIN) 5-325 MG tablet Take 1 tablet by mouth every 6 (six) hours as needed for moderate pain.   Yes Historical Provider, MD  amoxicillin-clavulanate (AUGMENTIN) 875-125 MG tablet Take 1 tablet by mouth every 12 (twelve) hours. 07/13/16   Lutricia Feil, PA-C  ibuprofen (ADVIL,MOTRIN) 800 MG tablet Take 1 tablet (800 mg total) by mouth 3 (three) times daily. 05/07/16   Domenick Gong, MD   Meds Ordered and Administered this Visit   Medications  acetaminophen (TYLENOL) tablet 500 mg (500 mg Oral Given 07/13/16 1702)  ibuprofen (ADVIL,MOTRIN) tablet 400 mg (400 mg Oral Given 07/13/16 1702)    BP 131/85 (BP Location: Left Arm)   Pulse 75   Temp 98 F (36.7 C) (Oral)   Resp 18   Ht 5\' 11"  (1.803 m)   Wt 211 lb (95.7 kg)   SpO2 99%   BMI 29.43 kg/m  No data found.   Physical Exam  Constitutional: He is oriented to person, place, and time. He appears well-developed and well-nourished. No distress.  HENT:   has mild swelling over the right cheek. Is tender in this area. Pronation of the dentition shows the socket without any noticeable clot that visualizing the area is so difficult to the patient's inability to fully open his mouth. Is tenderness to palpation in the gum area.  Eyes: Pupils are equal, round, and reactive to light. Right eye exhibits no discharge. Left eye exhibits no discharge.  Neck: Normal range of motion. Neck supple.  Musculoskeletal: Normal range of motion.  Lymphadenopathy:    He has no cervical adenopathy.  Neurological: He is alert and oriented to person, place, and time.  Skin: Skin is warm and dry. He is  not diaphoretic.  Psychiatric: He has a normal mood and affect. His behavior is normal. Judgment and thought content normal.  Nursing note and vitals reviewed.   Urgent Care Course     Procedures (including critical care time)  Labs Review Labs Reviewed - No data to display  Imaging Review No results found.   Visual Acuity Review  Right Eye Distance:   Left Eye Distance:   Bilateral Distance:    Right Eye Near:   Left Eye Near:    Bilateral Near:         MDM   1. Pain, dental   2. Dry tooth socket    Discharge Medication List as of 07/13/2016  5:05 PM    START taking  these medications   Details  amoxicillin-clavulanate (AUGMENTIN) 875-125 MG tablet Take 1 tablet by mouth every 12 (twelve) hours., Starting Sun 07/13/2016, Normal      Plan: 1. Test/x-ray results and diagnosis reviewed with patient 2. rx as per orders; risks, benefits, potential side effects reviewed with patient 3. Recommend supportive treatment with ice packs packs 20 minutes every 2 hours offered.the patient a Toradol injection which he refused .recommended he use Tylenol and ibuprofen together every 6 hours for pain control and he has 2 Vicodin left and he should use that tonight before bedtime. He should return to the dentist's office tomorrow morning.  4. F/u prn if symptoms worsen or don't improve     Lutricia FeilWilliam P Roemer, PA-C 07/13/16 1720

## 2016-07-13 NOTE — ED Triage Notes (Signed)
Pt had wisdom teeth taken out a week ago and is having severe pain. He called and left message for oral surgeon but they havent called him back. He is taking an antibiotic and Norco and the pain meds are not helping.

## 2016-10-02 ENCOUNTER — Ambulatory Visit
Admission: EM | Admit: 2016-10-02 | Discharge: 2016-10-02 | Disposition: A | Discharge status: Home / Self Care | Payer: BLUE CROSS/BLUE SHIELD | Attending: Family Medicine | Admitting: Family Medicine

## 2016-10-02 DIAGNOSIS — L600 Ingrowing nail: Secondary | ICD-10-CM

## 2016-10-02 DIAGNOSIS — L03031 Cellulitis of right toe: Secondary | ICD-10-CM

## 2016-10-02 MED ORDER — MUPIROCIN 2 % EX OINT: 1.0000 "application " | TOPICAL_OINTMENT | Freq: Two times a day (BID) | CUTANEOUS | 0 refills | Status: DC

## 2016-10-02 MED ORDER — CEFUROXIME AXETIL 500 MG PO TABS: 500.0000 mg | ORAL_TABLET | Freq: Two times a day (BID) | ORAL | 0 refills | Status: DC

## 2016-10-02 MED ORDER — OXYCODONE-ACETAMINOPHEN 5-325 MG PO TABS: 2.0000 | ORAL_TABLET | Freq: Three times a day (TID) | ORAL | 0 refills | Status: DC | PRN

## 2016-10-02 NOTE — ED Provider Notes (Signed)
[facility]    CSN: [id number] Arrival date & time: 10/02/16  1839     History   Chief Complaint Chief Complaint  Patient presents with  . Nail Problem    HPI Terry Shaffer is a 36 y.o. male.   Patient is here because of ingrown toenail on the right foot on the lateral border worse the base and along the foot. Patient's had both of the big toenails removed by me personally in the in the last few years. He is unwell the left foot but admits of the right foot he was doing fine as well to start feeling toenail in again like it. He tried to allow the toenail to grow out but did not. He is here because he wants to have the toenail removed.  Did give patient option of seeing podiatrist to have the toenail obliterated at the base or seeing if there is mother options and they could do for him. I did explain to him that since I have not found partial trimming to be effective I would not do partial trimming of the toenail . He admits that when he's had the toenail removed partially it grew right back along the previous tract and did not resolve the issue. The only procedure that seemed to be effective was the last time we'll remove the toenail completely and that is what he is requesting now.   The history is provided by the patient. No language interpreter was used.    Past Medical History:  Diagnosis Date  . Kidney stones     There are no active problems to display for this patient.   Past Surgical History:  Procedure Laterality Date  . HERNIA REPAIR    . PARATHYROIDECTOMY    . VASECTOMY Bilateral 10/12/14       Home Medications    Prior to Admission medications   Medication Sig Start Date End Date Taking? Authorizing Provider  amoxicillin-clavulanate (AUGMENTIN) 875-125 MG tablet Take 1 tablet by mouth every 12 (twelve) hours. 07/13/16   Lutricia Feil, PA-C  cefUROXime (CEFTIN) 500 MG tablet Take 1 tablet (500 mg total) by mouth 2 (two) times daily.  10/02/16   Hassan Rowan, MD  HYDROcodone-acetaminophen (NORCO/VICODIN) 5-325 MG tablet Take 1 tablet by mouth every 6 (six) hours as needed for moderate pain.    [provider]  ibuprofen (ADVIL,MOTRIN) 800 MG tablet Take 1 tablet (800 mg total) by mouth 3 (three) times daily. 05/07/16   Domenick Gong, MD  mupirocin ointment (BACTROBAN) 2 % Apply 1 application topically 2 (two) times daily. 10/02/16   Hassan Rowan, MD  oxyCODONE-acetaminophen (PERCOCET/ROXICET) 5-325 MG tablet Take 2 tablets by mouth every 8 (eight) hours as needed for moderate pain or severe pain. 10/02/16   Hassan Rowan, MD    Family History Family History  Problem Relation Age of Onset  . Cancer - Colon Mother   . Breast cancer Mother   . Hypertension Father     Social History Social History  Substance Use Topics  . Smoking status: Current Every Day Smoker    Packs/day: 0.25    Types: E-cigarettes  . Smokeless tobacco: Never Used     Comment: Vape  . Alcohol use No     Allergies   Diphenhydramine and Doxycycline   Review of Systems Review of Systems  Musculoskeletal: Positive for gait problem and myalgias.  All other systems reviewed and are negative.    Physical Exam Triage Vital Signs ED Triage  Vitals  Enc Vitals Group     BP 10/02/16 1846 138/83     Pulse Rate 10/02/16 1846 68     Resp 10/02/16 1846 16     Temp 10/02/16 1846 98.1 F (36.7 C)     Temp Source 10/02/16 1846 Oral     SpO2 10/02/16 1846 97 %     Weight 10/02/16 1845 213 lb (96.6 kg)     Height 10/02/16 1845 5\' 11"  (1.803 m)     Head Circumference --      Peak Flow --      Pain Score 10/02/16 1845 9     Pain Loc --      Pain Edu? --      Excl. in GC? --    No data found.   Updated Vital Signs BP 138/83 (BP Location: Left Arm)   Pulse 68   Temp 98.1 F (36.7 C) (Oral)   Resp 16   Ht 5\' 11"  (1.803 m)   Wt 213 lb (96.6 kg)   SpO2 97%   BMI 29.71 kg/m   Visual Acuity Right Eye Distance:   Left Eye  Distance:   Bilateral Distance:    Right Eye Near:   Left Eye Near:    Bilateral Near:     Physical Exam  Constitutional: He is oriented to person, place, and time. He appears well-developed and well-nourished.  HENT:  Head: Normocephalic.  Eyes: Pupils are equal, round, and reactive to light.  Neck: Normal range of motion.  Pulmonary/Chest: Effort normal.  Musculoskeletal: Normal range of motion. He exhibits tenderness.       Right foot: There is tenderness.       Feet:  With infection of the lateral border of the right foot worse at the base. The right big toe is definitely ingrown on the lateral side.  Neurological: He is alert and oriented to person, place, and time.  Skin: There is erythema.  Psychiatric: He has a normal mood and affect.  Vitals reviewed.    UC Treatments / Results  Labs (all labs ordered are listed, but only abnormal results are displayed) Labs Reviewed - No data to display  EKG  EKG Interpretation None       Radiology No results found.  Procedures Excise ingrown toenail Date/Time: 10/02/2016 8:08 PM Performed by: Hassan Rowan Authorized by: Hassan Rowan  Consent: Verbal consent obtained. Written consent obtained. Consent given by: patient Patient understanding: patient states understanding of the procedure being performed Patient identity confirmed: verbally with patient Preparation: Patient was prepped and draped in the usual sterile fashion. Local anesthesia used: yes Anesthesia: local infiltration and digital block  Anesthesia: Local anesthesia used: yes Local Anesthetic: lidocaine 1% without epinephrine and topical anesthetic  Sedation: Patient sedated: no Patient tolerance: Patient tolerated the procedure well with no immediate complications Comments: Patient tolerated procedure quite well after tourniquet was applied and the area was cleaned with Betadine. Toenail was lifted in its entirety removed. Toenail that was wrapped by  the nursing staff.    (including critical care time)  Medications Ordered in UC Medications - No data to display   Initial Impression / Assessment and Plan / UC Course  I have reviewed the triage vital signs and the nursing notes.  Pertinent labs & imaging results that were available during my care of the patient were reviewed by me and considered in my medical decision making (see chart for details).     Patient tolerated procedure well rapid  pressure dressing will recommend keeping the pressure discomfort 1-2 days back pain ointment on the nail bed Ceftin 500 mg 1 tablet twice a day Percocet 15 [facility] and found consistent as far as been given Percocet medication last prescription was back in November he did receive some Vicodin  from the dentist for dental procedure  Final Clinical Impressions(s) / UC Diagnoses   Final diagnoses:  Ingrown nail of great toe of right foot  Infection of nailbed of toe of right foot    New Prescriptions Discharge Medication List as of 10/02/2016  7:57 PM    START taking these medications   Details  cefUROXime (CEFTIN) 500 MG tablet Take 1 tablet (500 mg total) by mouth 2 (two) times daily., Starting Thu 10/02/2016, Normal    oxyCODONE-acetaminophen (PERCOCET/ROXICET) 5-325 MG tablet Take 2 tablets by mouth every 8 (eight) hours as needed for moderate pain or severe pain., Starting Thu 10/02/2016, Print         Note: This dictation was prepared with Dragon dictation along with smaller phrase technology. Any transcriptional errors that result from this process are unintentional.   Hassan RowanWade, Rakhi Romagnoli, MD 10/02/16 2010

## 2016-10-02 NOTE — ED Triage Notes (Signed)
Patient complains of great toe on right foot toenail pain. Patient states that pain started a couple weeks ago. Patient reports some drainage out of toe.

## 2016-10-05 ENCOUNTER — Ambulatory Visit (INDEPENDENT_AMBULATORY_CARE_PROVIDER_SITE_OTHER): Payer: BLUE CROSS/BLUE SHIELD

## 2016-10-05 ENCOUNTER — Ambulatory Visit
Admission: EM | Admit: 2016-10-05 | Discharge: 2016-10-05 | Disposition: A | Discharge status: Home / Self Care | Payer: BLUE CROSS/BLUE SHIELD | Attending: Family Medicine | Admitting: Family Medicine

## 2016-10-05 DIAGNOSIS — M79674 Pain in right toe(s): Secondary | ICD-10-CM

## 2016-10-05 MED ORDER — SULFAMETHOXAZOLE-TRIMETHOPRIM 800-160 MG PO TABS: 1.0000 | ORAL_TABLET | Freq: Two times a day (BID) | ORAL | 0 refills | Status: AC

## 2016-10-05 NOTE — ED Notes (Signed)
Arrives with right great toe bandaged already.   Limping gait with cane.    Pt in treatment room.  In NAD.  Needs address.  Pt/Fam updated on POC.

## 2016-10-05 NOTE — Discharge Instructions (Signed)
I have added bactrim.  Continue the percocet as needed.  Elevation, elevation, elevation.  Call podiatry.  Take care  Dr. Adriana Simasook

## 2016-10-05 NOTE — ED Triage Notes (Signed)
Was seen here Thursday.  Right great toe nail removed.  Yesterday pain started increasing with no relief.  Swollen, and painful today.  Reports inability to even bend toe due to swelling.  Has been taking ABX and pain meds as prescribed.   Limping gait with cane to RM9.

## 2016-10-05 NOTE — ED Provider Notes (Signed)
[facility]    CSN: [id number] Arrival date & time: 10/05/16  1007  History   Chief Complaint Chief Complaint  Patient presents with  . Toe Pain   HPI  36 year old male presents with right great toe pain.  Patient was seen on 5/17 for an ingrown toenail. Toenail was completely removed by Dr. Thurmond Butts. He was placed on Ceftin and given Percocet for pain.  Patient resents today for reevaluation. He reports worsening pain and swelling. Decreased ROM. He reports difficulty in ambulation. He endorses compliance with antibiotic. He's been taking pain medication as well. No redness. No fever. No other associated symptoms. No other complaints or concerns at this time.  Past Medical History:  Diagnosis Date  . Kidney stones    There are no active problems to display for this patient.  Past Surgical History:  Procedure Laterality Date  . HERNIA REPAIR    . PARATHYROIDECTOMY    . VASECTOMY Bilateral 10/12/14    Home Medications    Prior to Admission medications   Medication Sig Start Date End Date Taking? Authorizing Provider  cefUROXime (CEFTIN) 500 MG tablet Take 1 tablet (500 mg total) by mouth 2 (two) times daily. 10/02/16   Hassan Rowan, MD  ibuprofen (ADVIL,MOTRIN) 800 MG tablet Take 1 tablet (800 mg total) by mouth 3 (three) times daily. 05/07/16   Domenick Gong, MD  mupirocin ointment (BACTROBAN) 2 % Apply 1 application topically 2 (two) times daily. 10/02/16   Hassan Rowan, MD  oxyCODONE-acetaminophen (PERCOCET/ROXICET) 5-325 MG tablet Take 2 tablets by mouth every 8 (eight) hours as needed for moderate pain or severe pain. 10/02/16   Hassan Rowan, MD  sulfamethoxazole-trimethoprim (BACTRIM DS,SEPTRA DS) 800-160 MG tablet Take 1 tablet by mouth 2 (two) times daily. 10/05/16 10/12/16  Tommie Sams, DO   Family History Family History  Problem Relation Age of Onset  . Cancer - Colon Mother   . Breast cancer Mother   . Hypertension Father    Social History Social  History  Substance Use Topics  . Smoking status: Current Every Day Smoker    Packs/day: 0.25    Types: E-cigarettes  . Smokeless tobacco: Never Used     Comment: Vape  . Alcohol use No   Allergies   Diphenhydramine and Doxycycline  Review of Systems Review of Systems  Constitutional: Negative.   Musculoskeletal:       R great toe pain.    Physical Exam Triage Vital Signs ED Triage Vitals  Enc Vitals Group     BP 10/05/16 1028 130/81     Pulse Rate 10/05/16 1028 64     Resp 10/05/16 1028 16     Temp 10/05/16 1028 98 F (36.7 C)     Temp Source 10/05/16 1028 Oral     SpO2 10/05/16 1028 99 %     Weight 10/05/16 1030 212 lb (96.2 kg)     Height 10/05/16 1030 5\' 11"  (1.803 m)     Head Circumference --      Peak Flow --      Pain Score 10/05/16 1030 10     Pain Loc --      Pain Edu? --      Excl. in GC? --    Updated Vital Signs BP 130/81 (BP Location: Left Arm)   Pulse 64   Temp 98 F (36.7 C) (Oral)   Resp 16   Ht 5\' 11"  (1.803 m)   Wt 212 lb (96.2 kg)  SpO2 99%   BMI 29.57 kg/m   Physical Exam  Constitutional: He is oriented to person, place, and time. He appears well-developed. No distress.  Pulmonary/Chest: Effort normal.  Musculoskeletal:  Right great toe - toenail removed. No apparent purulence. Swelling of the great toe noted. Exquisitely and diffusely tender to palpation. Decreased range of motion. No erythema. No palpable warmth.  Neurological: He is alert and oriented to person, place, and time.  Psychiatric: He has a normal mood and affect.  Vitals reviewed.  UC Treatments / Results  Labs (all labs ordered are listed, but only abnormal results are displayed) Labs Reviewed - No data to display  EKG  EKG Interpretation None       Radiology Dg Toe Great Right  Result Date: 10/05/2016 CLINICAL DATA:  Pt with right big/1st toe pain and inflammation after having his toe nail removed three days ago. EXAM: RIGHT GREAT TOE COMPARISON:  None.  FINDINGS: No fracture. No bone lesion. There are no areas of bone resorption to suggest osteomyelitis. Joints are normally spaced and aligned.  No arthropathic change. Soft tissues are unremarkable. IMPRESSION: Negative. Electronically Signed   By: Amie Portlandavid  Ormond M.D.   On: 10/05/2016 11:34    Procedures Procedures (including critical care time)  Medications Ordered in UC Medications - No data to display   Initial Impression / Assessment and Plan / UC Course  I have reviewed the triage vital signs and the nursing notes.  Pertinent labs & imaging results that were available during my care of the patient were reviewed by me and considered in my medical decision making (see chart for details).    [age] are old male presents with complaints of right great toe pain which has been worsening. He had recent removal of his great toenail due to ingrown toenail. Continue Ceftin. Adding Doxy. X-ray was negative. Advised to see podiatry.   Final Clinical Impressions(s) / UC Diagnoses   Final diagnoses:  Great toe pain, right    New Prescriptions New Prescriptions   SULFAMETHOXAZOLE-TRIMETHOPRIM (BACTRIM DS,SEPTRA DS) 800-160 MG TABLET    Take 1 tablet by mouth 2 (two) times daily.     Tommie SamsCook, Milferd Ansell G, [place]DO 10/05/16 1149

## 2016-10-14 ENCOUNTER — Ambulatory Visit
Admission: EM | Admit: 2016-10-14 | Discharge: 2016-10-14 | Disposition: A | Discharge status: Home / Self Care | Payer: BLUE CROSS/BLUE SHIELD | Attending: Family Medicine | Admitting: Family Medicine

## 2016-10-14 ENCOUNTER — Encounter: Payer: Self-pay | Admitting: *Deleted

## 2016-10-14 DIAGNOSIS — L03031 Cellulitis of right toe: Secondary | ICD-10-CM

## 2016-10-14 DIAGNOSIS — L600 Ingrowing nail: Secondary | ICD-10-CM | POA: Diagnosis not present

## 2016-10-14 MED ORDER — LEVOFLOXACIN 500 MG PO TABS: 500.0000 mg | ORAL_TABLET | Freq: Every day | ORAL | 0 refills | Status: DC

## 2016-10-14 MED ORDER — MUPIROCIN 2 % EX OINT: 1.0000 "application " | TOPICAL_OINTMENT | Freq: Two times a day (BID) | CUTANEOUS | 0 refills | Status: DC

## 2016-10-14 NOTE — ED Provider Notes (Signed)
[facility]    CSN: [id number] Arrival date & time: 10/14/16  1049     History   Chief Complaint Chief Complaint  Patient presents with  . Follow-up    HPI Terry Shaffer is a 36 y.o. male.   Patient' big toenail almost 12 days ago on May 17. He was seen the following Sunday he states there was nothing new done but a prescription of doxycycline was added to his prescription of Ceftin according to Dr. Patsey Bertholdook's note. He will back to work on Friday and had increased pain in the toe. Does not wear socks he had difficulty getting his boot on and he did try to use bandage over the right great toe but that also will begin his boots. Explained to him that he needs to wear boots with socks to keep rubbing down.   The history is provided by the patient. No language interpreter was used.    Past Medical History:  Diagnosis Date  . Kidney stones     There are no active problems to display for this patient.   Past Surgical History:  Procedure Laterality Date  . HERNIA REPAIR    . PARATHYROIDECTOMY    . VASECTOMY Bilateral 10/12/14       Home Medications    Prior to Admission medications   Medication Sig Start Date End Date Taking? Authorizing Provider  ibuprofen (ADVIL,MOTRIN) 800 MG tablet Take 1 tablet (800 mg total) by mouth 3 (three) times daily. 05/07/16  Yes Domenick GongMortenson, Ashley, MD  oxyCODONE-acetaminophen (PERCOCET/ROXICET) 5-325 MG tablet Take 2 tablets by mouth every 8 (eight) hours as needed for moderate pain or severe pain. 10/02/16  Yes Hassan RowanWade, Jazir Newey, MD  cefUROXime (CEFTIN) 500 MG tablet Take 1 tablet (500 mg total) by mouth 2 (two) times daily. 10/02/16   Hassan RowanWade, Takai Chiaramonte, MD  levofloxacin (LEVAQUIN) 500 MG tablet Take 1 tablet (500 mg total) by mouth daily. 10/14/16   Hassan RowanWade, Kiarra Kidd, MD  mupirocin ointment (BACTROBAN) 2 % Apply 1 application topically 2 (two) times daily. 10/14/16   Hassan RowanWade, Desirae Mancusi, MD    Family History Family History  Problem Relation Age of  Onset  . Cancer - Colon Mother   . Breast cancer Mother   . Hypertension Father     Social History Social History  Substance Use Topics  . Smoking status: Current Every Day Smoker    Packs/day: 0.25    Types: E-cigarettes  . Smokeless tobacco: Never Used     Comment: Vape  . Alcohol use No     Allergies   Diphenhydramine and Doxycycline   Review of Systems Review of Systems  Musculoskeletal: Positive for gait problem.       Still tenderness over the right big toe     Physical Exam Triage Vital Signs ED Triage Vitals  Enc Vitals Group     BP 10/14/16 1144 119/80     Pulse Rate 10/14/16 1144 67     Resp 10/14/16 1144 16     Temp 10/14/16 1144 98 F (36.7 C)     Temp Source 10/14/16 1144 Oral     SpO2 10/14/16 1144 100 %     Weight --      Height --      Head Circumference --      Peak Flow --      Pain Score 10/14/16 1146 8     Pain Loc --      Pain Edu? --  Excl. in GC? --    No data found.   Updated Vital Signs BP 119/80 (BP Location: Left Arm)   Pulse 67   Temp 98 F (36.7 C) (Oral)   Resp 16   SpO2 100%   Visual Acuity Right Eye Distance:   Left Eye Distance:   Bilateral Distance:    Right Eye Near:   Left Eye Near:    Bilateral Near:     Physical Exam  Constitutional: He is oriented to person, place, and time. He appears well-developed and well-nourished.  HENT:  Head: Normocephalic and atraumatic.  Eyes: Pupils are equal, round, and reactive to light.  Neck: Normal range of motion.  Pulmonary/Chest: Effort normal.  Musculoskeletal: Normal range of motion. He exhibits tenderness.       Right foot: There is tenderness.       Feet:  Overall the right toe looks good still some drainage coming from the medial proximal border of the right toe. There is still some tenderness over that area as well.  Neurological: He is alert and oriented to person, place, and time.  Skin: Skin is warm.  Psychiatric: He has a normal mood and affect.       UC Treatments / Results  Labs (all labs ordered are listed, but only abnormal results are displayed) Labs Reviewed - No data to display  EKG  EKG Interpretation None       Radiology No results found.  Procedures Procedures (including critical care time)  Medications Ordered in UC Medications - No data to display   Initial Impression / Assessment and Plan / UC Course  I have reviewed the triage vital signs and the nursing notes.  Pertinent labs & imaging results that were available during my care of the patient were reviewed by me and considered in my medical decision making (see chart for details).   allow patient to go back to work on Thursday will switch him to a different antibiotic Levaquin 500 mg 1 tablet day for the next 10 days continue the Bactrim ointment. Change antibiotics as she stands aborted with Vicryl: Friday was extended water will have him follow-up in a week. The toe does have still some infection present but I think he is able to return to work on Thursday this week and stressed importance of not doing any type of exercise at least 2 weeks after taking the Levaquin due to the increase tendon weakness antibiotics of  this nature can cause  Final Clinical Impressions(s) / UC Diagnoses   Final diagnoses:  Ingrown nail of great toe of right foot  Infected nailbed of toe, right    New Prescriptions New Prescriptions   LEVOFLOXACIN (LEVAQUIN) 500 MG TABLET    Take 1 tablet (500 mg total) by mouth daily.     Note: This dictation was prepared with Dragon dictation along with smaller phrase technology. Any transcriptional errors that result from this process are unintentional.   Hassan Rowan, MD 10/14/16 1259

## 2016-10-14 NOTE — ED Triage Notes (Signed)
Follow up check on right big toenail.

## 2016-11-18 IMAGING — US US ART/VEN ABD/PELV/SCROTUM DOPPLER LTD
1 series · 14 of 25 positions shown · non-contrast
Comparison: CT abdomen pelvis 02/13/2016.

CLINICAL DATA: Patient with sudden onset left testicular pain.

EXAM:
SCROTAL ULTRASOUND
DOPPLER ULTRASOUND OF THE TESTICLES
TECHNIQUE: Complete ultrasound examination of the testicles, epididymis, and
other scrotal structures was performed. Color and spectral Doppler
ultrasound were also utilized to evaluate blood flow to the
testicles.

[Series 1: us art/ven abd/pelv/scrotum doppler ltd · 0.08mm/px · 14 of 60 slices shown]
[im 1/60]
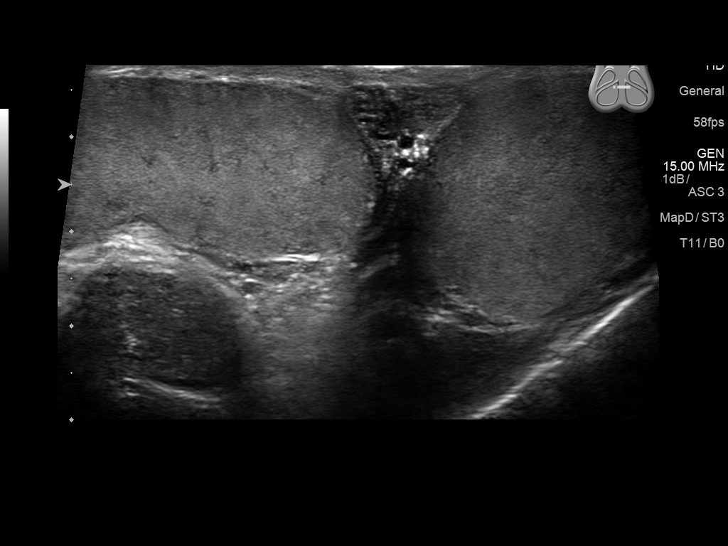
[im 5/60]
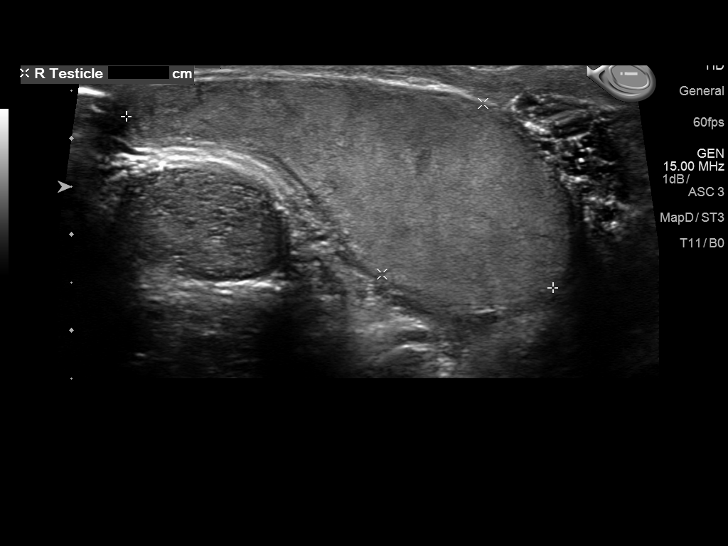
[im 10/60]
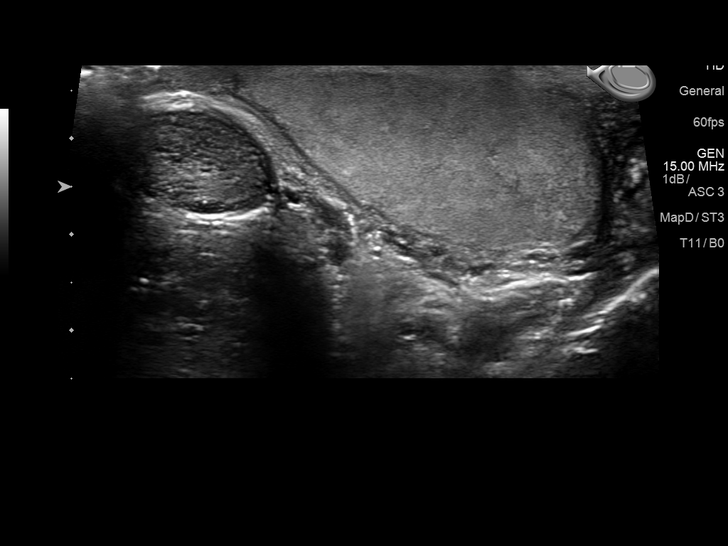
[im 15/60]
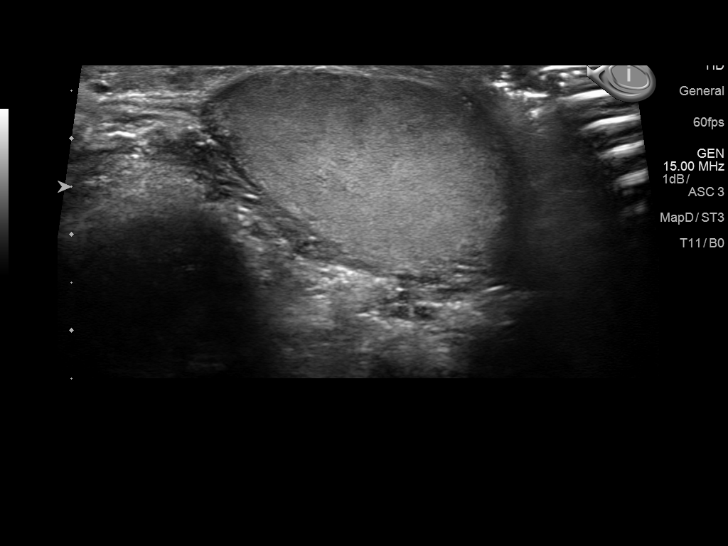
[im 20/60]
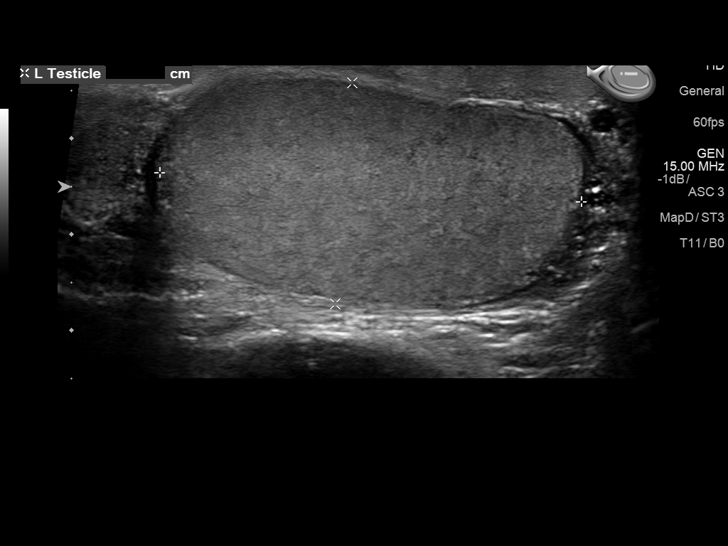
[im 23/60]
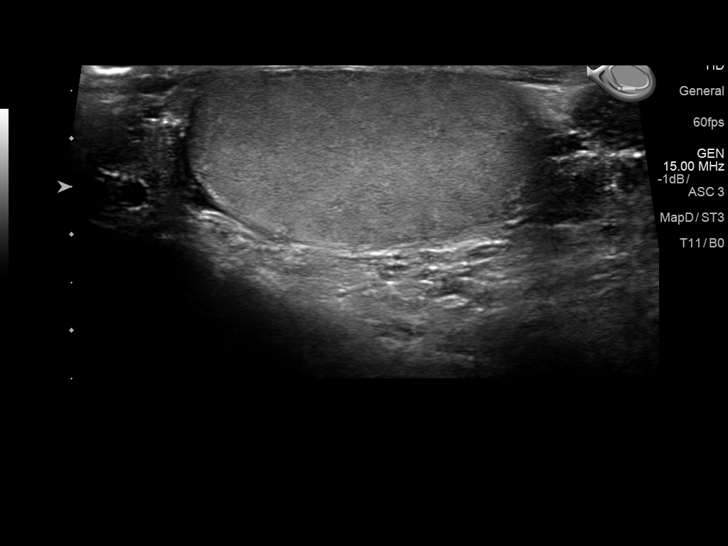
[im 28/60]
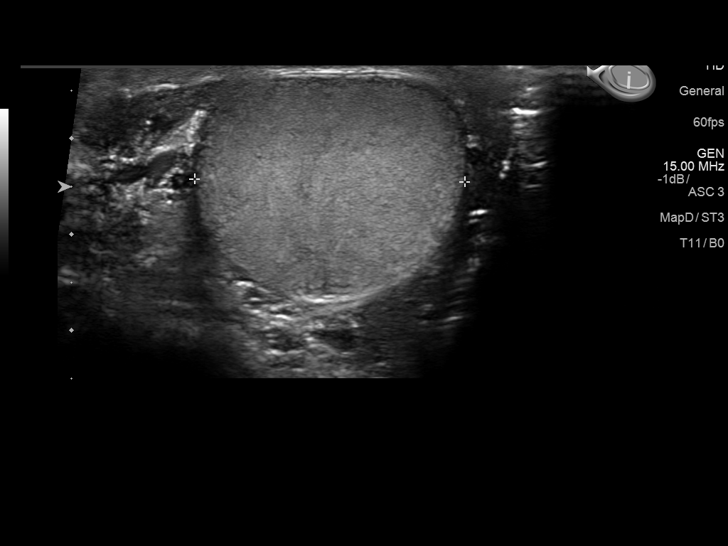
[im 32/60]
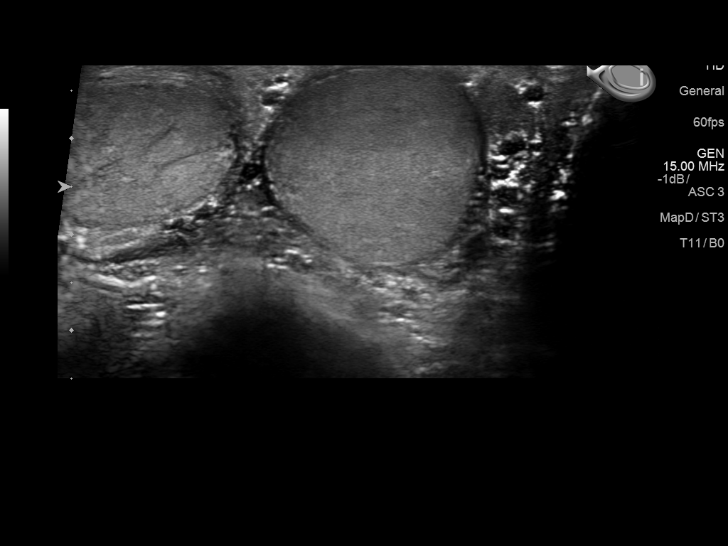
[im 37/60]
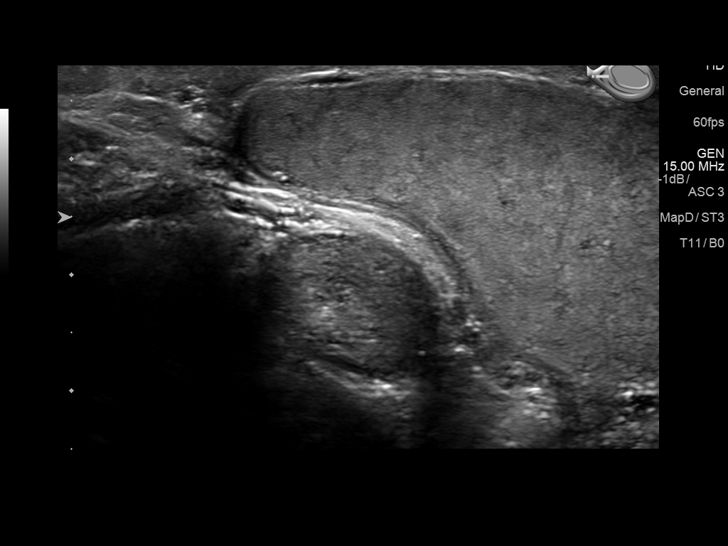
[im 40/60]
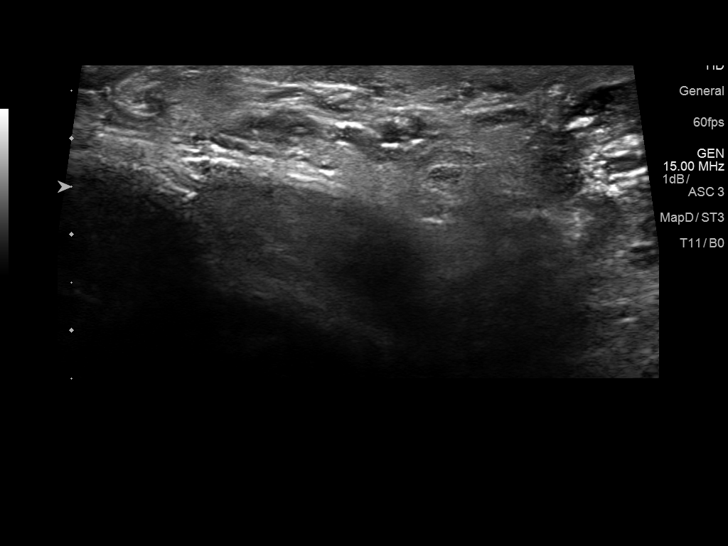
[im 45/60]
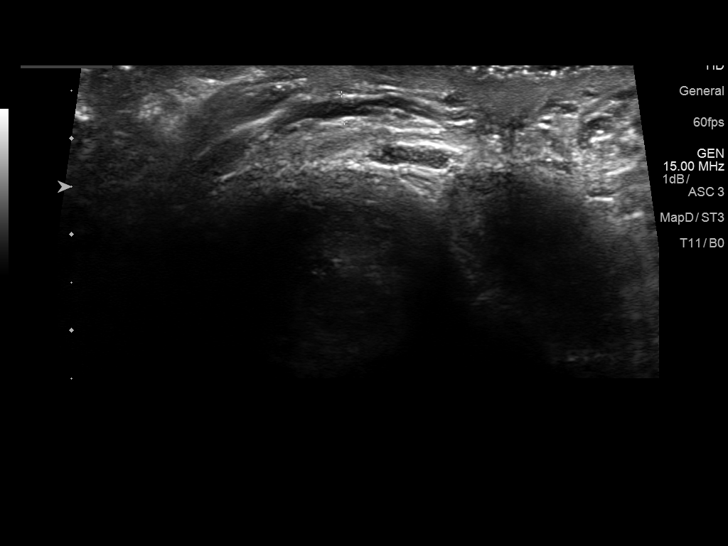
[im 50/60]
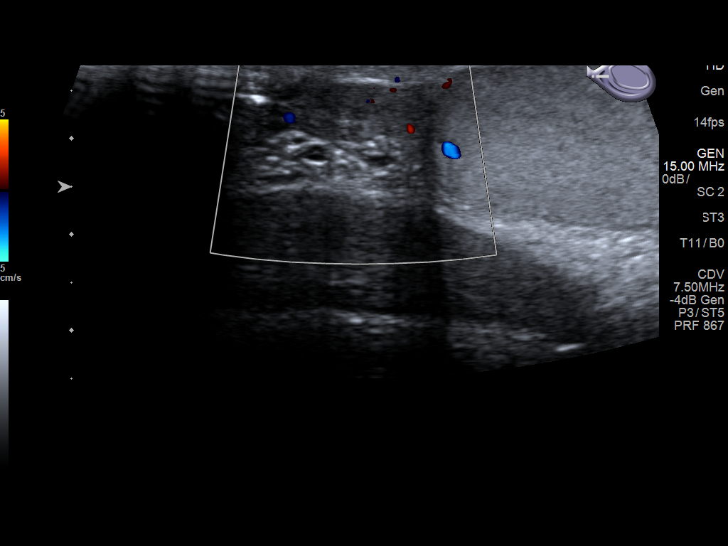
[im 55/60]
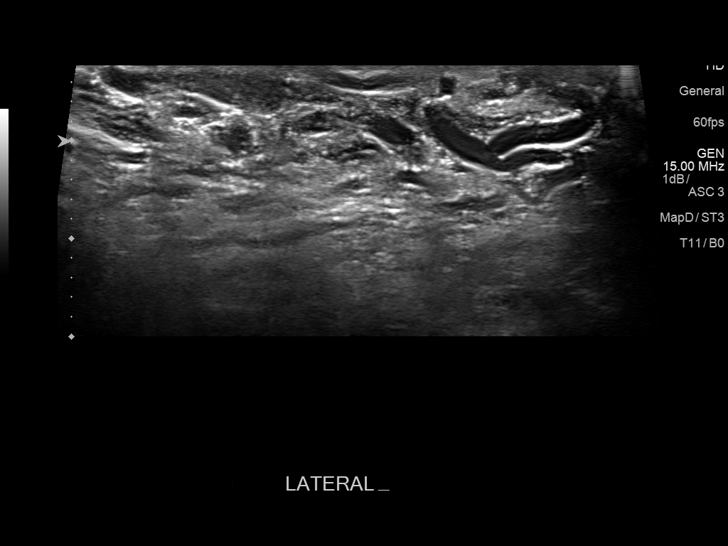
[im 60/60]
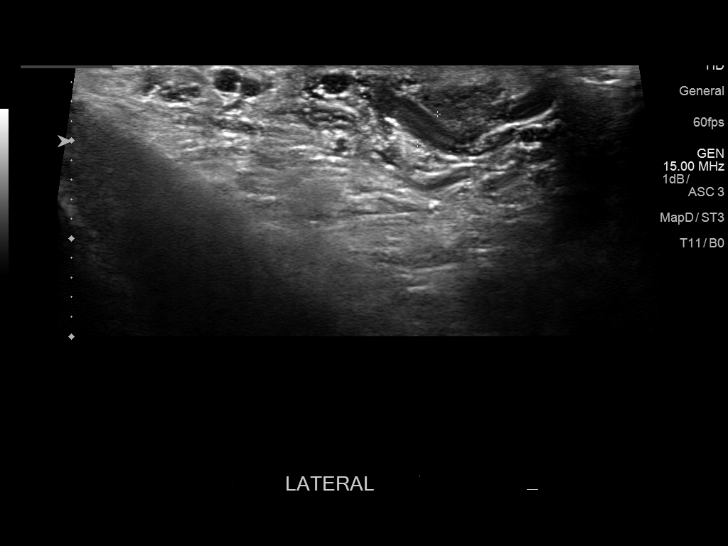

[14 of 25 positions shown; findings below may reference images not displayed]

FINDINGS: Right testicle

Measurements: 4.8 x 2.1 x 2.4 cm. No mass or microlithiasis
visualized.

Left testicle

Measurements: 4.4 x 2.3 x 2.8 cm. No mass or microlithiasis
visualized.

Right epididymis:  Normal in size and appearance.

Left epididymis:  Normal in size and appearance.

Hydrocele:  None visualized.

Varicocele:  Small left varicocele.

Pulsed Doppler interrogation of both testes demonstrates normal low
resistance arterial and venous waveforms bilaterally.
IMPRESSION: No sonographic evidence to suggest torsion.

Small left varicocele.

## 2016-12-08 ENCOUNTER — Emergency Department
Admission: EM | Admit: 2016-12-08 | Discharge: 2016-12-08 | Disposition: A | Discharge status: Home / Self Care | Payer: BLUE CROSS/BLUE SHIELD | Attending: Emergency Medicine | Admitting: Emergency Medicine

## 2016-12-08 ENCOUNTER — Emergency Department: Payer: BLUE CROSS/BLUE SHIELD

## 2016-12-08 DIAGNOSIS — N50812 Left testicular pain: Secondary | ICD-10-CM | POA: Insufficient documentation

## 2016-12-08 DIAGNOSIS — N5089 Other specified disorders of the male genital organs: Secondary | ICD-10-CM

## 2016-12-08 DIAGNOSIS — Z79899 Other long term (current) drug therapy: Secondary | ICD-10-CM | POA: Insufficient documentation

## 2016-12-08 DIAGNOSIS — N509 Disorder of male genital organs, unspecified: Secondary | ICD-10-CM | POA: Insufficient documentation

## 2016-12-08 DIAGNOSIS — Z791 Long term (current) use of non-steroidal anti-inflammatories (NSAID): Secondary | ICD-10-CM | POA: Insufficient documentation

## 2016-12-08 DIAGNOSIS — R52 Pain, unspecified: Secondary | ICD-10-CM

## 2016-12-08 DIAGNOSIS — F17290 Nicotine dependence, other tobacco product, uncomplicated: Secondary | ICD-10-CM | POA: Diagnosis not present

## 2016-12-08 MED ORDER — OXYCODONE-ACETAMINOPHEN 5-325 MG PO TABS
1.0000 | ORAL_TABLET | ORAL | Status: DC | PRN
  Administered 2016-12-08: 1 via ORAL
  Filled 2016-12-08: qty 1

## 2016-12-08 MED ORDER — OXYCODONE-ACETAMINOPHEN 5-325 MG PO TABS: 1.0000 | ORAL_TABLET | Freq: Four times a day (QID) | ORAL | 0 refills | Status: DC | PRN

## 2016-12-08 MED ORDER — KETOROLAC TROMETHAMINE 60 MG/2ML IM SOLN
60.0000 mg | Freq: Once | INTRAMUSCULAR | Status: AC
  Administered 2016-12-08: 60 mg via INTRAMUSCULAR
  Filled 2016-12-08: qty 2

## 2016-12-08 MED ORDER — ONDANSETRON 4 MG PO TBDP
4.0000 mg | ORAL_TABLET | Freq: Once | ORAL | Status: AC
  Administered 2016-12-08: 4 mg via ORAL
  Filled 2016-12-08: qty 1

## 2016-12-08 MED ORDER — CIPROFLOXACIN HCL 500 MG PO TABS: 500.0000 mg | ORAL_TABLET | Freq: Two times a day (BID) | ORAL | 0 refills | Status: AC

## 2016-12-08 MED ORDER — CIPROFLOXACIN HCL 500 MG PO TABS
500.0000 mg | ORAL_TABLET | Freq: Once | ORAL | Status: AC
  Administered 2016-12-08: 500 mg via ORAL
  Filled 2016-12-08: qty 1

## 2016-12-08 MED ORDER — ONDANSETRON 4 MG PO TBDP: 4.0000 mg | ORAL_TABLET | Freq: Three times a day (TID) | ORAL | 0 refills | Status: DC | PRN

## 2016-12-08 NOTE — ED Provider Notes (Signed)
[facility] Emergency Department Provider Note  Time seen: 7:09 PM  I have reviewed the triage vital signs and the nursing notes.   HISTORY  Chief Complaint Testicle Pain    HPI Terry Shaffer is a 36 y.o. male who presents to the emergency department for left testicular pain. According to the patient for the past 5 days he's been experiencing somewhat worsening left testicular pain. States a similar pain happened 6 months ago, had an ultrasound there is largely negative at that time. States the pain is progressively worsened so he came to the emergency department for evaluation. States some nausea due to the pain. Denies any abdominal pain or vomiting. Denies dysuria. Denies any fever. Denies any unprotected sex.  Past Medical History:  Diagnosis Date  . Kidney stones     There are no active problems to display for this patient.   Past Surgical History:  Procedure Laterality Date  . HERNIA REPAIR    . PARATHYROIDECTOMY    . VASECTOMY Bilateral 10/12/14    Prior to Admission medications   Medication Sig Start Date End Date Taking? Authorizing Provider  cefUROXime (CEFTIN) 500 MG tablet Take 1 tablet (500 mg total) by mouth 2 (two) times daily. 10/02/16   Hassan Rowan, MD  ibuprofen (ADVIL,MOTRIN) 800 MG tablet Take 1 tablet (800 mg total) by mouth 3 (three) times daily. 05/07/16   Domenick Gong, MD  levofloxacin (LEVAQUIN) 500 MG tablet Take 1 tablet (500 mg total) by mouth daily. 10/14/16   Hassan Rowan, MD  mupirocin ointment (BACTROBAN) 2 % Apply 1 application topically 2 (two) times daily. 10/14/16   Hassan Rowan, MD  oxyCODONE-acetaminophen (PERCOCET/ROXICET) 5-325 MG tablet Take 2 tablets by mouth every 8 (eight) hours as needed for moderate pain or severe pain. 10/02/16   Hassan Rowan, MD    Allergies  Allergen Reactions  . Diphenhydramine Swelling  . Doxycycline Other (See Comments)    Reaction: Jittery    Family History  Problem  Relation Age of Onset  . Cancer - Colon Mother   . Breast cancer Mother   . Hypertension Father     Social History Social History  Substance Use Topics  . Smoking status: Current Every Day Smoker    Packs/day: 0.25    Types: E-cigarettes  . Smokeless tobacco: Never Used     Comment: Vape  . Alcohol use No    Review of Systems Constitutional: Negative for fever. Cardiovascular: Negative for chest pain. Respiratory: Negative for shortness of breath. Gastrointestinal: Negative for abdominal pain, vomiting and diarrhea. Does state some nausea Genitourinary: Negative for dysuria. Significant left testicular pain. Denies penile discharge. Musculoskeletal: Negative for back pain. Neurological: Negative for headache All other ROS negative  ____________________________________________   PHYSICAL EXAM:  VITAL SIGNS: ED Triage Vitals  Enc Vitals Group     BP 12/08/16 1649 (!) 145/93     Pulse Rate 12/08/16 1649 85     Resp 12/08/16 1649 18     Temp 12/08/16 1649 98.4 F (36.9 C)     Temp Source 12/08/16 1649 Oral     SpO2 12/08/16 1649 100 %     Weight 12/08/16 1650 219 lb (99.3 kg)     Height 12/08/16 1650 5\' 11"  (1.803 m)     Head Circumference --      Peak Flow --      Pain Score 12/08/16 1649 9     Pain Loc --      Pain Edu? --  Excl. in GC? --     Constitutional: Alert and oriented. Well appearing and in no distress. Eyes: Normal exam ENT   Head: Normocephalic and atraumatic   Mouth/Throat: Mucous membranes are moist. Cardiovascular: Normal rate, regular rhythm. No murmur Respiratory: Normal respiratory effort without tachypnea nor retractions. Breath sounds are clear  Gastrointestinal: Soft and nontender. No distention.   Genitourinary: Normal GU appearance. However the patient has moderate left testicular epididymal tenderness. The testicle itself appears normal size and nontender. Right testicle is normal. No obvious swelling or  erythema. Musculoskeletal: Nontender with normal range of motion in all extremities.  Neurologic:  Normal speech and language. No gross focal neurologic deficits Skin:  Skin is warm, dry and intact.  Psychiatric: Mood and affect are normal.  ____________________________________________   RADIOLOGY  IMPRESSION: 1. Negative for testicular mass or torsion. 2. Left epididymal tail 1 cm nodule. Consider urologic consultation.  ____________________________________________   INITIAL IMPRESSION / ASSESSMENT AND PLAN / ED COURSE  Pertinent labs & imaging results that were available during my care of the patient were reviewed by me and considered in my medical decision making (see chart for details).  Patient presents to the emergency department for left testicular pain for the past 5 days, progressively worsening. Patient sees a urologist Dr. Ferd HibbsMcKinsey. I discussed the findings of the epididymal nodule and the need to follow-up with urology. Patient will follow up with Dr. Ferd HibbsMcKinsey. We will treat with Toradol and Zofran in the emergency department. Given his epididymal tenderness we will start the patient on an antibiotic for the next 10 days as a precaution until he can see urology. We will discharge with pain medication. Patient agreeable to this plan.  ____________________________________________   FINAL CLINICAL IMPRESSION(S) / ED DIAGNOSES  Left testicular pain Epididymal mass    Minna AntisPaduchowski, Eulis Salazar, MD 12/08/16 [phone]

## 2016-12-08 NOTE — ED Notes (Addendum)
Patient c/o pain on left side of scrotum. Patient denies dysuria or penile discharge. Will defer physical exam for MD.

## 2016-12-08 NOTE — ED Triage Notes (Signed)
Pt arrives to ER via POV c/o testicle pain since Wednesday. Pt hunched over in pain. States severity of pain has been 9 since it started.

## 2016-12-08 NOTE — Discharge Instructions (Signed)
Please follow-up with urology regarding your epididymal mass/nodule, as well as your left epididymal/testicular pain. Return to the emergency department for any acute worsening of pain or fever.

## 2017-03-17 ENCOUNTER — Ambulatory Visit
Admission: EM | Admit: 2017-03-17 | Discharge: 2017-03-17 | Disposition: A | Discharge status: Home / Self Care | Payer: BLUE CROSS/BLUE SHIELD | Attending: Family Medicine | Admitting: Family Medicine

## 2017-03-17 ENCOUNTER — Encounter: Payer: Self-pay | Admitting: Emergency Medicine

## 2017-03-17 DIAGNOSIS — J069 Acute upper respiratory infection, unspecified: Secondary | ICD-10-CM

## 2017-03-17 DIAGNOSIS — R05 Cough: Secondary | ICD-10-CM

## 2017-03-17 DIAGNOSIS — J029 Acute pharyngitis, unspecified: Secondary | ICD-10-CM | POA: Diagnosis not present

## 2017-03-17 DIAGNOSIS — B9789 Other viral agents as the cause of diseases classified elsewhere: Secondary | ICD-10-CM

## 2017-03-17 LAB — RAPID STREP SCREEN (MED CTR MEBANE ONLY): Streptococcus, Group A Screen (Direct): NEGATIVE

## 2017-03-17 MED ORDER — LIDOCAINE VISCOUS 2 % MT SOLN: OROMUCOSAL | 0 refills | Status: DC

## 2017-03-17 NOTE — ED Triage Notes (Signed)
Patient in tonight c/o sore throat, productive cough, body aches, headache and chills. X 3 days. Patient has tried OTC Mucinex Sinus.

## 2017-03-17 NOTE — ED Provider Notes (Signed)
[facility]    CSN: [id number] Arrival date & time: 03/17/17  1820     History   Chief Complaint Chief Complaint  Patient presents with  . flu like symptoms    HPI Terry Shaffer is a 36 y.o. male.   The history is provided by the patient.  URI  Presenting symptoms: congestion, cough, fever, rhinorrhea and sore throat   Severity:  Moderate Onset quality:  Sudden Duration:  3 days Timing:  Constant Progression:  Worsening Chronicity:  New Relieved by:  Nothing Ineffective treatments:  OTC medications Associated symptoms: myalgias   Associated symptoms: no headaches and no wheezing   Risk factors: not elderly, no chronic cardiac disease, no chronic kidney disease, no chronic respiratory disease, no diabetes mellitus, no immunosuppression, no recent illness and no recent travel     Past Medical History:  Diagnosis Date  . Kidney stones     There are no active problems to display for this patient.   Past Surgical History:  Procedure Laterality Date  . HERNIA REPAIR    . PARATHYROIDECTOMY    . VASECTOMY Bilateral 10/12/14       Home Medications    Prior to Admission medications   Medication Sig Start Date End Date Taking? Authorizing Provider  cefUROXime (CEFTIN) 500 MG tablet Take 1 tablet (500 mg total) by mouth 2 (two) times daily. 10/02/16   Hassan Rowan, MD  ibuprofen (ADVIL,MOTRIN) 800 MG tablet Take 1 tablet (800 mg total) by mouth 3 (three) times daily. 05/07/16   Domenick Gong, MD  levofloxacin (LEVAQUIN) 500 MG tablet Take 1 tablet (500 mg total) by mouth daily. 10/14/16   Hassan Rowan, MD  lidocaine (XYLOCAINE) 2 % solution 20 ml gargle and spit q 6 hours prn sore throat 03/17/17   Payton Mccallum, MD  mupirocin ointment (BACTROBAN) 2 % Apply 1 application topically 2 (two) times daily. 10/14/16   Hassan Rowan, MD  ondansetron (ZOFRAN ODT) 4 MG disintegrating tablet Take 1 tablet (4 mg total) by mouth every 8 (eight) hours as needed  for nausea or vomiting. 12/08/16   Minna Antis, MD  oxyCODONE-acetaminophen (ROXICET) 5-325 MG tablet Take 1 tablet by mouth every 6 (six) hours as needed. 12/08/16   Minna Antis, MD    Family History Family History  Problem Relation Age of Onset  . Cancer - Colon Mother   . Breast cancer Mother   . Hypertension Father     Social History Social History  Substance Use Topics  . Smoking status: Current Every Day Smoker    Packs/day: 0.25    Types: E-cigarettes  . Smokeless tobacco: Never Used     Comment: Vape  . Alcohol use No     Allergies   Diphenhydramine and Doxycycline   Review of Systems Review of Systems  Constitutional: Positive for fever.  HENT: Positive for congestion, rhinorrhea and sore throat.   Respiratory: Positive for cough. Negative for wheezing.   Musculoskeletal: Positive for myalgias.  Neurological: Negative for headaches.     Physical Exam Triage Vital Signs ED Triage Vitals [03/17/17 1838]  Enc Vitals Group     BP 133/84     Pulse Rate 78     Resp 16     Temp 98.5 F (36.9 C)     Temp Source Oral     SpO2 100 %     Weight 213 lb (96.6 kg)     Height 5\' 11"  (1.803 m)     Head  Circumference      Peak Flow      Pain Score 7     Pain Loc      Pain Edu?      Excl. in GC?    No data found.   Updated Vital Signs BP 133/84 (BP Location: Left Arm)   Pulse 78   Temp 98.5 F (36.9 C) (Oral)   Resp 16   Ht 5\' 11"  (1.803 m)   Wt 213 lb (96.6 kg)   SpO2 100%   BMI 29.71 kg/m   Visual Acuity Right Eye Distance:   Left Eye Distance:   Bilateral Distance:    Right Eye Near:   Left Eye Near:    Bilateral Near:     Physical Exam  Constitutional: He appears well-developed and well-nourished. No distress.  HENT:  Head: Normocephalic and atraumatic.  Right Ear: Tympanic membrane, external ear and ear canal normal.  Left Ear: Tympanic membrane, external ear and ear canal normal.  Nose: Nose normal.  Mouth/Throat: Uvula  is midline, oropharynx is clear and moist and mucous membranes are normal. No oropharyngeal exudate or tonsillar abscesses.  Eyes: Pupils are equal, round, and reactive to light. Conjunctivae and EOM are normal. Right eye exhibits no discharge. Left eye exhibits no discharge. No scleral icterus.  Neck: Normal range of motion. Neck supple. No tracheal deviation present. No thyromegaly present.  Cardiovascular: Normal rate, regular rhythm and normal heart sounds.   Pulmonary/Chest: Effort normal and breath sounds normal. No stridor. No respiratory distress. He has no wheezes. He has no rales. He exhibits no tenderness.  Lymphadenopathy:    He has no cervical adenopathy.  Neurological: He is alert.  Skin: Skin is warm and dry. No rash noted. He is not diaphoretic.  Nursing note and vitals reviewed.    UC Treatments / Results  Labs (all labs ordered are listed, but only abnormal results are displayed) Labs Reviewed  RAPID STREP SCREEN (NOT AT [facility])  CULTURE, GROUP A STREP [facility])    EKG  EKG Interpretation None       Radiology No results found.  Procedures Procedures (including critical care time)  Medications Ordered in UC Medications - No data to display   Initial Impression / Assessment and Plan / UC Course  I have reviewed the triage vital signs and the nursing notes.  Pertinent labs & imaging results that were available during my care of the patient were reviewed by me and considered in my medical decision making (see chart for details).       Final Clinical Impressions(s) / UC Diagnoses   Final diagnoses:  Viral URI with cough  Sore throat    New Prescriptions New Prescriptions   LIDOCAINE (XYLOCAINE) 2 % SOLUTION    20 ml gargle and spit q 6 hours prn sore throat   1. Lab result (negative strep) and diagnosis reviewed with patient 2. rx as per orders above; reviewed possible side effects, interactions, risks and benefits  3. Recommend supportive treatment  with rest, fluids, otc meds 4. Follow-up prn if symptoms worsen or don't improve Controlled Substance Prescriptions [place] Controlled Substance Registry consulted? Not Applicable   Payton Mccallumonty, Romello Hoehn, MD 03/17/17 [phone]

## 2017-03-19 ENCOUNTER — Ambulatory Visit
Admission: EM | Admit: 2017-03-19 | Discharge: 2017-03-19 | Disposition: A | Discharge status: Home / Self Care | Payer: BLUE CROSS/BLUE SHIELD | Attending: Family Medicine | Admitting: Family Medicine

## 2017-03-19 DIAGNOSIS — J069 Acute upper respiratory infection, unspecified: Secondary | ICD-10-CM | POA: Diagnosis not present

## 2017-03-19 DIAGNOSIS — R05 Cough: Secondary | ICD-10-CM

## 2017-03-19 MED ORDER — BENZONATATE 200 MG PO CAPS: ORAL_CAPSULE | ORAL | 0 refills | Status: DC

## 2017-03-19 MED ORDER — AZITHROMYCIN 250 MG PO TABS: ORAL_TABLET | ORAL | 0 refills | Status: DC

## 2017-03-19 MED ORDER — HYDROCOD POLST-CPM POLST ER 10-8 MG/5ML PO SUER: 5.0000 mL | Freq: Two times a day (BID) | ORAL | 0 refills | Status: DC

## 2017-03-19 NOTE — ED Triage Notes (Signed)
Patient complains of cough, congestion, body aches x 6 days. Patient states that he was seen 2 days ago and feels like he is worsening. Patient doesn't believe that this is a viral infection any longer since it is settling in his chest.

## 2017-03-19 NOTE — ED Provider Notes (Signed)
[facility]    CSN: [id number] Arrival date & time: 03/19/17  1807     History   Chief Complaint Chief Complaint  Patient presents with  . Cough    HPI Terry Shaffer is a 36 y.o. male.   HPI  36 year old male who returns today seen 2 days ago and diagnosed with a viral URI. He denies cough congestion body aches for 6 days this feels it is been worsening since the was seen here. He has felt feverish but is not running a fever. Cough is almost constant especially at nighttime.         Past Medical History:  Diagnosis Date  . Kidney stones     There are no active problems to display for this patient.   Past Surgical History:  Procedure Laterality Date  . HERNIA REPAIR    . PARATHYROIDECTOMY    . VASECTOMY Bilateral 10/12/14       Home Medications    Prior to Admission medications   Medication Sig Start Date End Date Taking? Authorizing Provider  ibuprofen (ADVIL,MOTRIN) 800 MG tablet Take 1 tablet (800 mg total) by mouth 3 (three) times daily. 05/07/16  Yes Domenick Gong, MD  lidocaine (XYLOCAINE) 2 % solution 20 ml gargle and spit q 6 hours prn sore throat 03/17/17  Yes Payton Mccallum, MD  azithromycin (ZITHROMAX Z-PAK) 250 MG tablet Use as per package instructions 03/19/17   Lutricia Feil, PA-C  benzonatate (TESSALON) 200 MG capsule Take one cap TID PRN cough 03/19/17   Lutricia Feil, PA-C  chlorpheniramine-HYDROcodone (TUSSIONEX PENNKINETIC ER) 10-8 MG/5ML SUER Take 5 mLs by mouth 2 (two) times daily. 03/19/17   Lutricia Feil, PA-C    Family History Family History  Problem Relation Age of Onset  . Cancer - Colon Mother   . Breast cancer Mother   . Hypertension Father     Social History Social History  Substance Use Topics  . Smoking status: Current Every Day Smoker    Packs/day: 0.25    Types: E-cigarettes  . Smokeless tobacco: Never Used     Comment: Vape  . Alcohol use No     Allergies   Diphenhydramine and  Doxycycline   Review of Systems Review of Systems  Constitutional: Positive for activity change, chills and fatigue. Negative for fever.  HENT: Positive for congestion and postnasal drip.   Respiratory: Positive for cough and shortness of breath.   All other systems reviewed and are negative.    Physical Exam Triage Vital Signs ED Triage Vitals  Enc Vitals Group     BP 03/19/17 1818 126/89     Pulse Rate 03/19/17 1818 67     Resp 03/19/17 1818 17     Temp 03/19/17 1818 98.1 F (36.7 C)     Temp Source 03/19/17 1818 Oral     SpO2 03/19/17 1818 100 %     Weight 03/19/17 1817 216 lb (98 kg)     Height 03/19/17 1817 5\' 11"  (1.803 m)     Head Circumference --      Peak Flow --      Pain Score 03/19/17 1817 6     Pain Loc --      Pain Edu? --      Excl. in GC? --    No data found.   Updated Vital Signs BP 126/89 (BP Location: Left Arm)   Pulse 67   Temp 98.1 F (36.7 C) (Oral)   Resp  17   Ht 5\' 11"  (1.803 m)   Wt 216 lb (98 kg)   SpO2 100%   BMI 30.13 kg/m   Visual Acuity Right Eye Distance:   Left Eye Distance:   Bilateral Distance:    Right Eye Near:   Left Eye Near:    Bilateral Near:     Physical Exam  Constitutional: He is oriented to person, place, and time. He appears well-developed and well-nourished. No distress.  HENT:  Head: Normocephalic.  Right Ear: External ear normal.  Left Ear: External ear normal.  Nose: Nose normal.  Mouth/Throat: Oropharynx is clear and moist. No oropharyngeal exudate.  Eyes: Pupils are equal, round, and reactive to light. Right eye exhibits no discharge. Left eye exhibits no discharge.  Neck: Normal range of motion.  Pulmonary/Chest: Effort normal and breath sounds normal.  Musculoskeletal: Normal range of motion.  Lymphadenopathy:    He has no cervical adenopathy.  Neurological: He is alert and oriented to person, place, and time.  Skin: Skin is warm and dry. He is not diaphoretic.  Psychiatric: He has a normal  mood and affect. His behavior is normal. Judgment and thought content normal.  Nursing note and vitals reviewed.    UC Treatments / Results  Labs (all labs ordered are listed, but only abnormal results are displayed) Labs Reviewed - No data to display  EKG  EKG Interpretation None       Radiology No results found.  Procedures Procedures (including critical care time)  Medications Ordered in UC Medications - No data to display   Initial Impression / Assessment and Plan / UC Course  I have reviewed the triage vital signs and the nursing notes.  Pertinent labs & imaging results that were available during my care of the patient were reviewed by me and considered in my medical decision making (see chart for details).     Plan: 1. Test/x-ray results and diagnosis reviewed with patient 2. rx as per orders; risks, benefits, potential side effects reviewed with patient 3. Recommend supportive treatment with fluids and rest. Use Tussionex at nighttime for coughing and Tessalon Perles during the daytime. Do not drive or perform activities requiring concentration or  judgment while taking the Tussionex. Also given him a prescription for a Z-Pak since he has had this for 6 days and is worsening. I've also advised him that if it does not seem to be helping him that this is definitely viral illness and will need to run its course. 4. F/u prn if symptoms worsen or don't improve   Final Clinical Impressions(s) / UC Diagnoses   Final diagnoses:  Upper respiratory tract infection, unspecified type    New Prescriptions Discharge Medication List as of 03/19/2017  6:40 PM    START taking these medications   Details  azithromycin (ZITHROMAX Z-PAK) 250 MG tablet Use as per package instructions, Normal    benzonatate (TESSALON) 200 MG capsule Take one cap TID PRN cough, Normal    chlorpheniramine-HYDROcodone (TUSSIONEX PENNKINETIC ER) 10-8 MG/5ML SUER Take 5 mLs by mouth 2 (two) times  daily., Starting Thu 03/19/2017, Print         Controlled Substance Prescriptions [place] Controlled Substance Registry consulted? Not Applicable   Lutricia FeilRoemer, Boleslaw Borghi P, PA-C 03/19/17 1850

## 2017-03-20 LAB — CULTURE, GROUP A STREP (THRC)

## 2017-06-07 ENCOUNTER — Encounter: Payer: Self-pay | Admitting: Emergency Medicine

## 2017-06-07 ENCOUNTER — Other Ambulatory Visit: Payer: Self-pay

## 2017-06-07 ENCOUNTER — Emergency Department
Admission: EM | Admit: 2017-06-07 | Discharge: 2017-06-07 | Disposition: A | Discharge status: Home / Self Care | Payer: BLUE CROSS/BLUE SHIELD | Attending: Emergency Medicine | Admitting: Emergency Medicine

## 2017-06-07 DIAGNOSIS — F17290 Nicotine dependence, other tobacco product, uncomplicated: Secondary | ICD-10-CM | POA: Diagnosis not present

## 2017-06-07 DIAGNOSIS — Y929 Unspecified place or not applicable: Secondary | ICD-10-CM | POA: Insufficient documentation

## 2017-06-07 DIAGNOSIS — Y998 Other external cause status: Secondary | ICD-10-CM | POA: Diagnosis not present

## 2017-06-07 DIAGNOSIS — S39012A Strain of muscle, fascia and tendon of lower back, initial encounter: Secondary | ICD-10-CM | POA: Insufficient documentation

## 2017-06-07 DIAGNOSIS — M5432 Sciatica, left side: Secondary | ICD-10-CM | POA: Insufficient documentation

## 2017-06-07 DIAGNOSIS — X503XXA Overexertion from repetitive movements, initial encounter: Secondary | ICD-10-CM | POA: Insufficient documentation

## 2017-06-07 DIAGNOSIS — S3992XA Unspecified injury of lower back, initial encounter: Secondary | ICD-10-CM | POA: Diagnosis present

## 2017-06-07 DIAGNOSIS — Y9351 Activity, roller skating (inline) and skateboarding: Secondary | ICD-10-CM | POA: Insufficient documentation

## 2017-06-07 MED ORDER — PREDNISONE 20 MG PO TABS
60.0000 mg | ORAL_TABLET | Freq: Once | ORAL | Status: AC
  Administered 2017-06-07: 60 mg via ORAL
  Filled 2017-06-07: qty 3

## 2017-06-07 MED ORDER — PREDNISONE 10 MG (21) PO TBPK: ORAL_TABLET | ORAL | 0 refills | Status: DC

## 2017-06-07 MED ORDER — BACLOFEN 10 MG PO TABS: 10.0000 mg | ORAL_TABLET | Freq: Three times a day (TID) | ORAL | 0 refills | Status: AC | PRN

## 2017-06-07 NOTE — Discharge Instructions (Signed)
Your exam is consistent with a lumbar strain and irritation of your sciatic nerve. Take the prescription meds as directed. Apply ice or moist heat to reduce symptoms. You should select and follow-up with one of the local adult care clinics for routine care and ongoing management.

## 2017-06-07 NOTE — ED Triage Notes (Signed)
Pt c/o L sided pain that radiates down the back of his L leg. Pt states feels like his L leg is asleep. Pt ambulatory with steady gate at this time.

## 2017-06-07 NOTE — ED Provider Notes (Signed)
[facility] Emergency Department Provider Note ____________________________________________  Time seen: 1015  I have reviewed the triage vital signs and the nursing notes.  HISTORY  Chief Complaint  Back Pain  HPI Terry Shaffer is a 37 y.o. male presents to the ED for evaluation of left-sided LBP with rediation down the posterior L leg. He denies any injury, accident, trauma, or fall.  He does admit to onset yesterday evening after he spent the day rollerskating with his kids. He denies any outright fall, but describes nearly falling several times while motor skills.  He reports paresthesias that feel like his left leg is asleep. He is without foot drop, leg weakness, or incontinence. He denies a history of chronic or ongoing leg or back pain.  He took a single Advil tablet last night but denies any significant benefit.  He reports a similar episode several years back but is unclear what the diagnosis was.  He recalls treated with prescription medications and eventually getting better.  Past Medical History:  Diagnosis Date  . Kidney stones     There are no active problems to display for this patient.   Past Surgical History:  Procedure Laterality Date  . HERNIA REPAIR    . PARATHYROIDECTOMY    . VASECTOMY Bilateral 10/12/14    Prior to Admission medications   Medication Sig Start Date End Date Taking? Authorizing Provider  azithromycin (ZITHROMAX Z-PAK) 250 MG tablet Use as per package instructions 03/19/17   Lutricia Feil, PA-C  baclofen (LIORESAL) 10 MG tablet Take 1 tablet (10 mg total) by mouth 3 (three) times daily as needed for up to 10 days for muscle spasms. 06/07/17 06/17/17  Esthefany Herrig, Charlesetta Ivory, PA-C  benzonatate (TESSALON) 200 MG capsule Take one cap TID PRN cough 03/19/17   Lutricia Feil, PA-C  chlorpheniramine-HYDROcodone [facility] ER) 10-8 MG/5ML SUER Take 5 mLs by mouth 2 (two) times daily. 03/19/17   Lutricia Feil,  PA-C  ibuprofen (ADVIL,MOTRIN) 800 MG tablet Take 1 tablet (800 mg total) by mouth 3 (three) times daily. 05/07/16   Domenick Gong, MD  lidocaine (XYLOCAINE) 2 % solution 20 ml gargle and spit q 6 hours prn sore throat 03/17/17   Payton Mccallum, MD  predniSONE (STERAPRED UNI-PAK 21 TAB) 10 MG (21) TBPK tablet 6-day taper as directed. 06/07/17   Kron Everton, Charlesetta Ivory, PA-C    Allergies Diphenhydramine and Doxycycline  Family History  Problem Relation Age of Onset  . Cancer - Colon Mother   . Breast cancer Mother   . Hypertension Father     Social History Social History   Tobacco Use  . Smoking status: Current Every Day Smoker    Packs/day: 0.25    Types: E-cigarettes  . Smokeless tobacco: Never Used  . Tobacco comment: Vape  Substance Use Topics  . Alcohol use: No    Alcohol/week: 0.0 oz  . Drug use: No    Review of Systems  Constitutional: Negative for fever. Cardiovascular: Negative for chest pain. Respiratory: Negative for shortness of breath. Gastrointestinal: Negative for abdominal pain, vomiting and diarrhea. Genitourinary: Negative for dysuria. Musculoskeletal: Positive for back pain. Skin: Negative for rash. Neurological: Negative for headaches, focal weakness or numbness. ____________________________________________  PHYSICAL EXAM:  VITAL SIGNS: ED Triage Vitals  Enc Vitals Group     BP 06/07/17 1001 (!) 141/83     Pulse Rate 06/07/17 1001 83     Resp 06/07/17 1001 16     Temp 06/07/17  1001 97.6 F (36.4 C)     Temp Source 06/07/17 1001 Oral     SpO2 06/07/17 1001 97 %     Weight 06/07/17 0959 215 lb (97.5 kg)     Height 06/07/17 0959 5\' 11"  (1.803 m)     Head Circumference --      Peak Flow --      Pain Score --      Pain Loc --      Pain Edu? --      Excl. in GC? --     Constitutional: Alert and oriented. Well appearing and in no distress. Head: Normocephalic and atraumatic. Cardiovascular: Normal rate, regular rhythm. Normal distal  pulses. Respiratory: Normal respiratory effort. No wheezes/rales/rhonchi. Gastrointestinal: Soft and nontender. No distention. Musculoskeletal: Normal spinal alignment without midline tenderness, spasm, deformity, or step-off.  Patient transitions from sit to stand without assistance.  He is able to demonstrate normal toe and heel raise on exam.  He is with some mild tenderness to palpation over the left lumbar sacral junction. Hip flexion range is normal.  Nontender with normal range of motion in all extremities.  Neurologic: Cranial nerves II through XII grossly intact.  Normal LE DTRs bilaterally.  Normal toe dorsiflexion and foot eversion on exam. Supine straight leg raise without distal paresthesias. Mildly antalgic gait without ataxia. Normal speech and language. No gross focal neurologic deficits are appreciated. Skin:  Skin is warm, dry and intact. No rash noted. ____________________________________________  PROCEDURES  Procedures Prednisone 60 mg PO ____________________________________________  INITIAL IMPRESSION / ASSESSMENT AND PLAN / ED COURSE  Patient with ED evaluation of acute LBP with LLE sciatic irritation. His exam is essentially normal. There is no acute neuromuscular deficit. He is discharged with a prescription for prednisone and Baclofen. He is further advises to select a primary care provider and follow-up with them for ongoing symptoms. ____________________________________________  FINAL CLINICAL IMPRESSION(S) / ED DIAGNOSES  Final diagnoses:  Sciatica of left side  Strain of lumbar region, initial encounter      Lissa HoardMenshew, Trevino Wyatt V Bacon, PA-C 06/07/17 1108    Governor RooksLord, Rebecca, MD 06/07/17 1535

## 2017-07-26 ENCOUNTER — Other Ambulatory Visit: Payer: Self-pay

## 2017-07-26 ENCOUNTER — Ambulatory Visit
Admission: EM | Admit: 2017-07-26 | Discharge: 2017-07-26 | Disposition: A | Discharge status: Home / Self Care | Payer: BLUE CROSS/BLUE SHIELD | Attending: Emergency Medicine | Admitting: Emergency Medicine

## 2017-07-26 DIAGNOSIS — B9789 Other viral agents as the cause of diseases classified elsewhere: Secondary | ICD-10-CM

## 2017-07-26 DIAGNOSIS — M791 Myalgia, unspecified site: Secondary | ICD-10-CM

## 2017-07-26 DIAGNOSIS — J029 Acute pharyngitis, unspecified: Secondary | ICD-10-CM

## 2017-07-26 DIAGNOSIS — R05 Cough: Secondary | ICD-10-CM

## 2017-07-26 DIAGNOSIS — R0981 Nasal congestion: Secondary | ICD-10-CM

## 2017-07-26 DIAGNOSIS — J069 Acute upper respiratory infection, unspecified: Secondary | ICD-10-CM | POA: Diagnosis not present

## 2017-07-26 LAB — RAPID STREP SCREEN (MED CTR MEBANE ONLY): Streptococcus, Group A Screen (Direct): NEGATIVE

## 2017-07-26 LAB — RAPID INFLUENZA A&B ANTIGENS (ARMC ONLY)
INFLUENZA A (ARMC): NEGATIVE
INFLUENZA B (ARMC): NEGATIVE

## 2017-07-26 MED ORDER — HYDROCOD POLST-CPM POLST ER 10-8 MG/5ML PO SUER: 5.0000 mL | Freq: Two times a day (BID) | ORAL | 0 refills | Status: DC

## 2017-07-26 MED ORDER — BENZONATATE 200 MG PO CAPS: ORAL_CAPSULE | ORAL | 0 refills | Status: DC

## 2017-07-26 MED ORDER — FLUTICASONE PROPIONATE 50 MCG/ACT NA SUSP: 2.0000 | Freq: Every day | NASAL | 0 refills | Status: DC

## 2017-07-26 NOTE — ED Provider Notes (Signed)
[facility]    CSN: [id number] Arrival date & time: 07/26/17  [id number]     History   Chief Complaint Chief Complaint  Patient presents with  . Cough    HPI Terry Shaffer is a 37 y.o. male.   HPI  [name] resents with symptoms of cough that is particularly worse at nighttime chest congestion as well as sinus congestion sore throat and body aches that he has had for 6 days.  States that it started on the evening of Monday and seem to be getting better by Wednesday but then then on Thursday became much worse.  Has had chills and felt feverish.  He is afebrile today at 97 9;pulse rate of 95; O2 sats 99% on room air.        Past Medical History:  Diagnosis Date  . Kidney stones     There are no active problems to display for this patient.   Past Surgical History:  Procedure Laterality Date  . HERNIA REPAIR    . PARATHYROIDECTOMY    . VASECTOMY Bilateral 10/12/14       Home Medications    Prior to Admission medications   Medication Sig Start Date End Date Taking? Authorizing Provider  ibuprofen (ADVIL,MOTRIN) 800 MG tablet Take 1 tablet (800 mg total) by mouth 3 (three) times daily. 05/07/16  Yes Domenick GongMortenson, Ashley, MD  benzonatate (TESSALON) 200 MG capsule Take one cap TID PRN cough 07/26/17   Lutricia Feiloemer, William P, PA-C  chlorpheniramine-HYDROcodone [facility](TUSSIONEX PENNKINETIC ER) 10-8 MG/5ML SUER Take 5 mLs by mouth 2 (two) times daily. 07/26/17   Lutricia Feiloemer, William P, PA-C  fluticasone (FLONASE) 50 MCG/ACT nasal spray Place 2 sprays into both nostrils daily. 07/26/17   Lutricia Feiloemer, William P, PA-C    Family History Family History  Problem Relation Age of Onset  . Cancer - Colon Mother   . Breast cancer Mother   . Hypertension Father     Social History Social History   Tobacco Use  . Smoking status: Former Smoker    Packs/day: 0.25    Types: E-cigarettes  . Smokeless tobacco: Never Used  . Tobacco comment: Vape  Substance Use Topics  . Alcohol use: No      Alcohol/week: 0.0 oz  . Drug use: No     Allergies   Diphenhydramine and Doxycycline   Review of Systems Review of Systems  Constitutional: Positive for activity change, chills, fatigue and fever.  HENT: Positive for congestion, rhinorrhea, sinus pressure, sinus pain and sore throat.   Respiratory: Positive for cough.   All other systems reviewed and are negative.    Physical Exam Triage Vital Signs ED Triage Vitals  Enc Vitals Group     BP 07/26/17 0825 (!) 136/94     Pulse Rate 07/26/17 0825 95     Resp --      Temp 07/26/17 0825 97.9 F (36.6 C)     Temp Source 07/26/17 0825 Oral     SpO2 07/26/17 0825 99 %     Weight 07/26/17 0823 220 lb (99.8 kg)     Height 07/26/17 0823 5\' 11"  (1.803 m)     Head Circumference --      Peak Flow --      Pain Score 07/26/17 0823 9     Pain Loc --      Pain Edu? --      Excl. in GC? --    No data found.  Updated Vital Signs BP Marland Kitchen(!)  136/94 (BP Location: Left Arm)   Pulse 95   Temp 97.9 F (36.6 C) (Oral)   Ht 5\' 11"  (1.803 m)   Wt 220 lb (99.8 kg)   SpO2 99%   BMI 30.68 kg/m   Visual Acuity Right Eye Distance:   Left Eye Distance:   Bilateral Distance:    Right Eye Near:   Left Eye Near:    Bilateral Near:     Physical Exam  Constitutional: He is oriented to person, place, and time. He appears well-developed and well-nourished. No distress.  HENT:  Head: Normocephalic.  Right Ear: External ear normal.  Left Ear: External ear normal.  Nose: Nose normal.  Mouth/Throat: Oropharynx is clear and moist. No oropharyngeal exudate.  Eyes: Pupils are equal, round, and reactive to light. Right eye exhibits no discharge. Left eye exhibits no discharge.  Neck: Normal range of motion.  Pulmonary/Chest: Effort normal and breath sounds normal.  Musculoskeletal: Normal range of motion.  Lymphadenopathy:    He has no cervical adenopathy.  Neurological: He is alert and oriented to person, place, and time.  Skin: Skin is  warm and dry. He is not diaphoretic.  Psychiatric: He has a normal mood and affect. His behavior is normal. Judgment and thought content normal.  Nursing note and vitals reviewed.    UC Treatments / Results  Labs (all labs ordered are listed, but only abnormal results are displayed) Labs Reviewed  RAPID STREP SCREEN (NOT AT [facility])  RAPID INFLUENZA A&B ANTIGENS ([facility] ONLY)  CULTURE, GROUP A STREP [facility])    EKG  EKG Interpretation None       Radiology No results found.  Procedures Procedures (including critical care time)  Medications Ordered in UC Medications - No data to display   Initial Impression / Assessment and Plan / UC Course  I have reviewed the triage vital signs and the nursing notes.  Pertinent labs & imaging results that were available during my care of the patient were reviewed by me and considered in my medical decision making (see chart for details).     Plan: 1. Test/x-ray results and diagnosis reviewed with patient 2. rx as per orders; risks, benefits, potential side effects reviewed with patient 3. Recommend supportive treatment with use of Flonase on a daily basis.  Ibuprofen or Tylenol for body aches and fever.  We will treat the cough symptomatically.  Told him this is a viral illness at this point in time and does not require antibiotics.  If he continues to have his symptoms or if he runs high fevers or has a more productive cough he should return to our clinic. 4. F/u prn if symptoms worsen or don't improve   Final Clinical Impressions(s) / UC Diagnoses   Final diagnoses:  Viral URI with cough    ED Discharge Orders        Ordered    benzonatate (TESSALON) 200 MG capsule     07/26/17 0900    chlorpheniramine-HYDROcodone (TUSSIONEX PENNKINETIC ER) 10-8 MG/5ML SUER  2 times daily     07/26/17 0900    fluticasone (FLONASE) 50 MCG/ACT nasal spray  Daily     07/26/17 0900       Controlled Substance Prescriptions [place] Controlled  Substance Registry consulted? Not Applicable   Lutricia Feil, PA-C 07/26/17 1610

## 2017-07-26 NOTE — ED Triage Notes (Signed)
Patient is c/o cough, congestion, sore throat and body aches x 6 days.

## 2017-07-29 LAB — CULTURE, GROUP A STREP (THRC)

## 2017-07-30 IMAGING — US US SCROTUM
1 series · 14 of 25 positions shown · non-contrast
Comparison: 03/29/2016

CLINICAL DATA: Severe left-sided pain x5 days

EXAM:
SCROTAL ULTRASOUND
DOPPLER ULTRASOUND OF THE TESTICLES
TECHNIQUE: Complete ultrasound examination of the testicles, epididymis, and
other scrotal structures was performed. Color and spectral Doppler
ultrasound were also utilized to evaluate blood flow to the
testicles.

[Series 1: us scrotum · 0.07mm/px · 66 acquisitions, 14 frames shown]
[im 1/66]
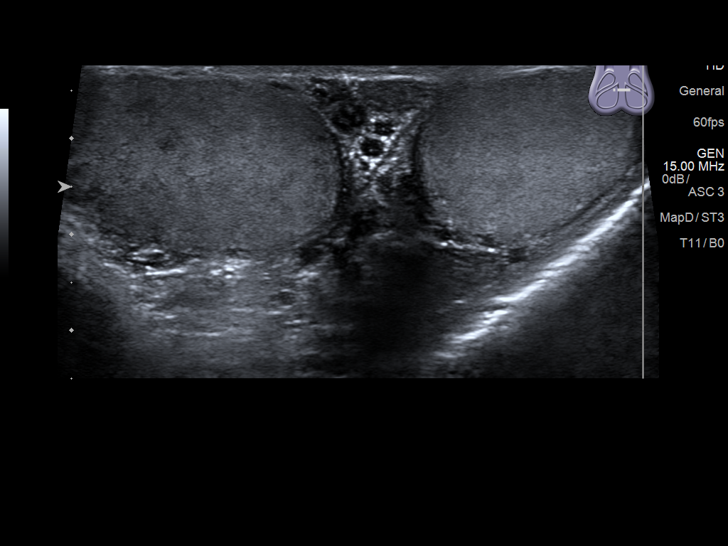
[im 6/66]
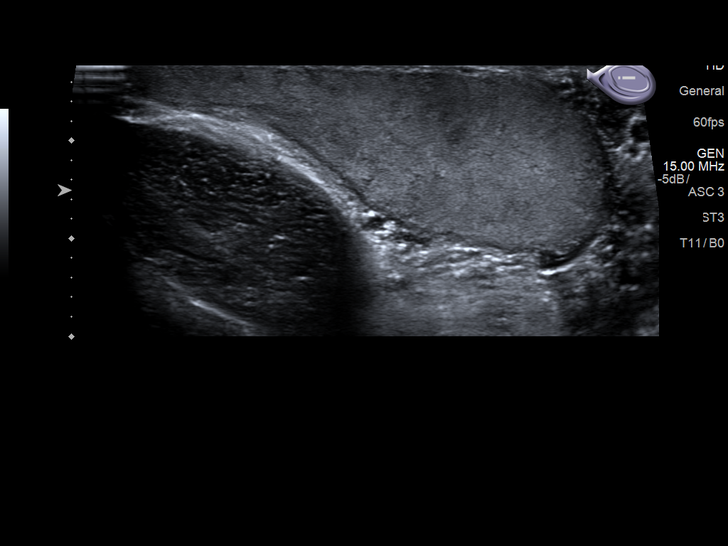
[im 11/66]
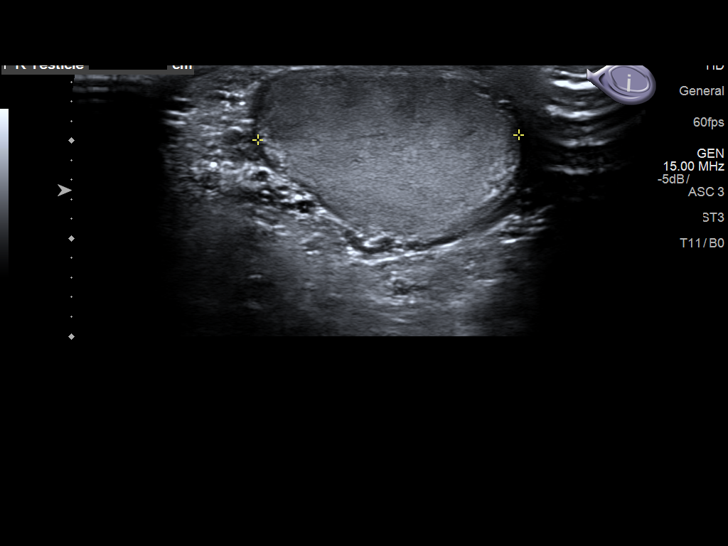
[im 17/66]
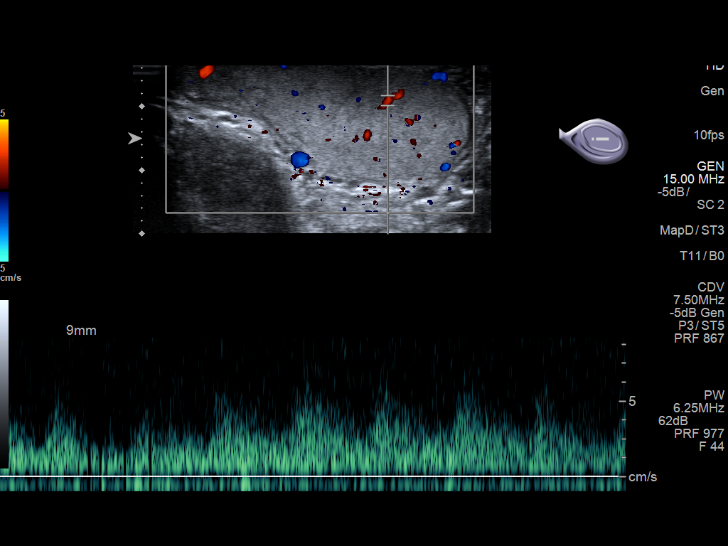
[im 22/66]
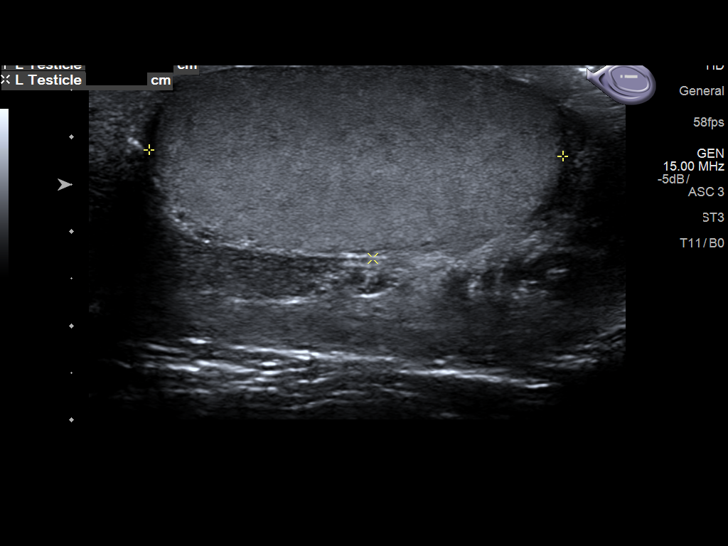
[im 25/66]
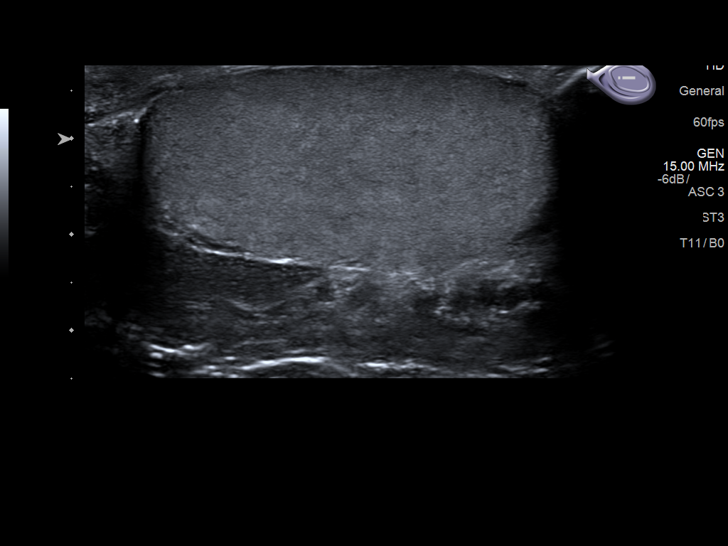
[im 30/66]
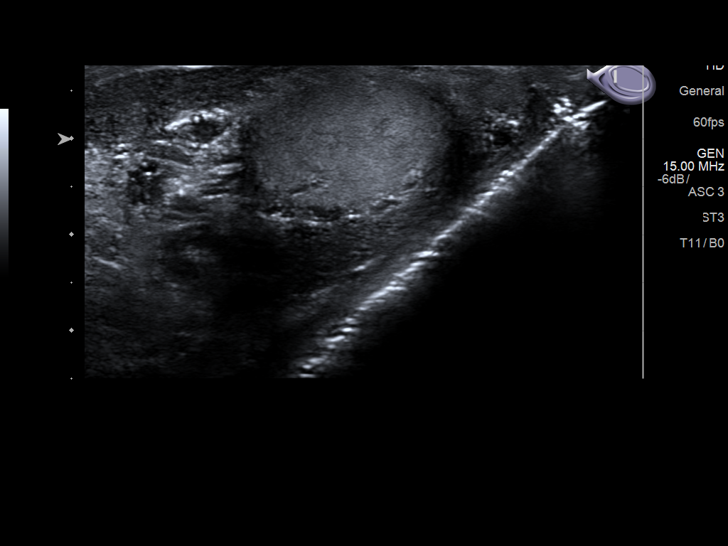
[im 36/66]
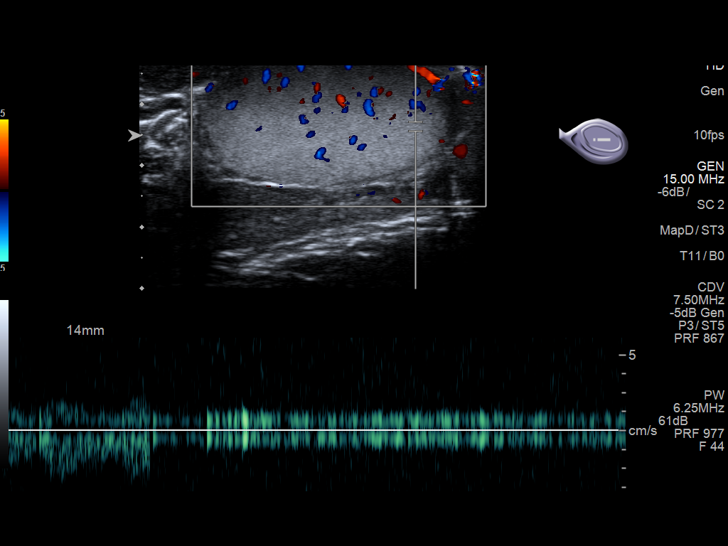
[im 41/66]
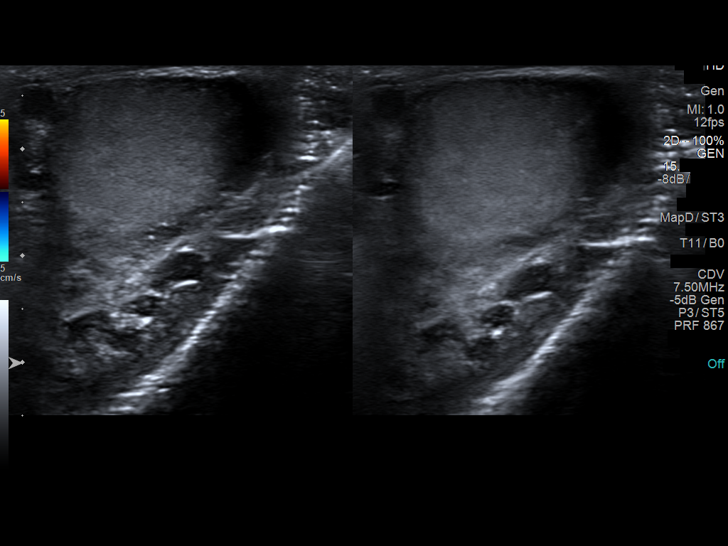
[im 44/66]
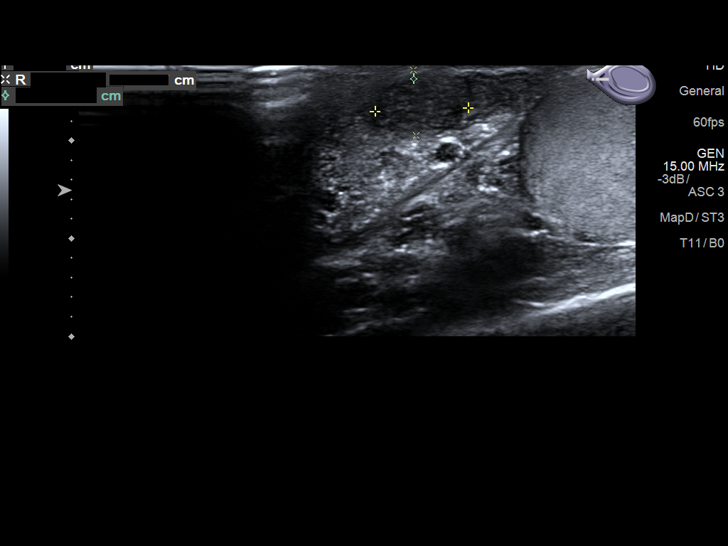
[im 49/66]
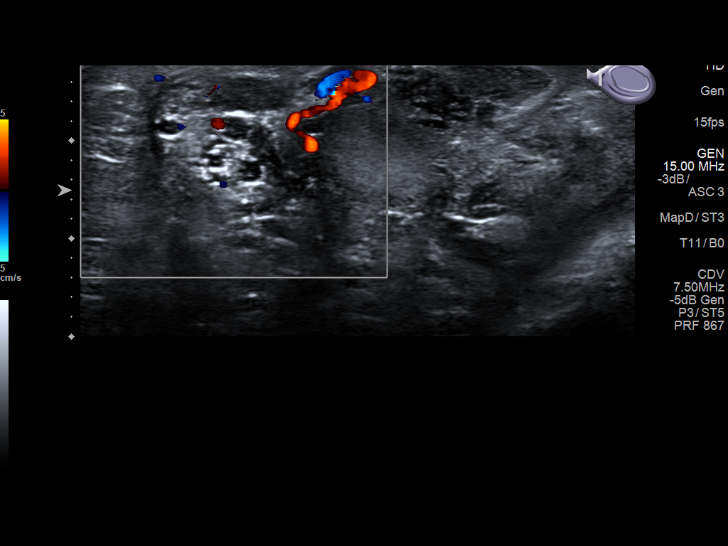
[im 55/66]
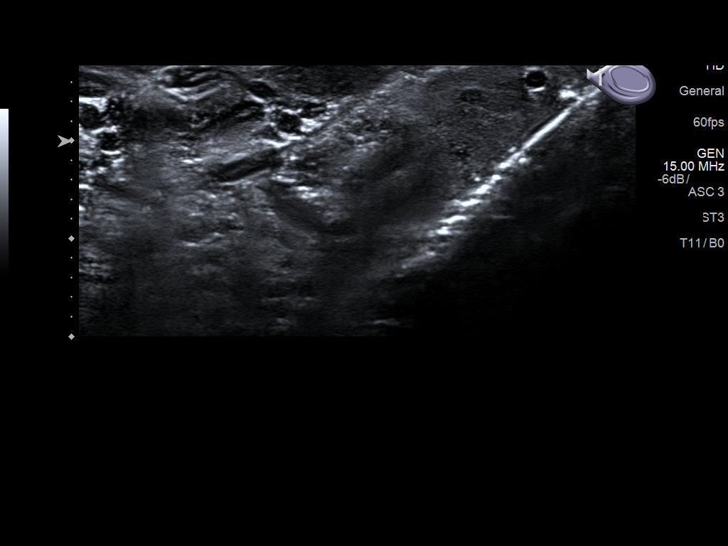
[im 60/66]
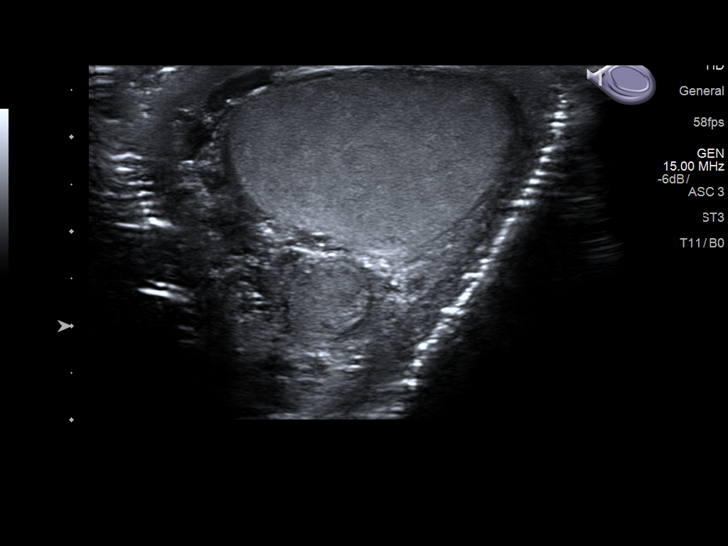
[im 66/66]
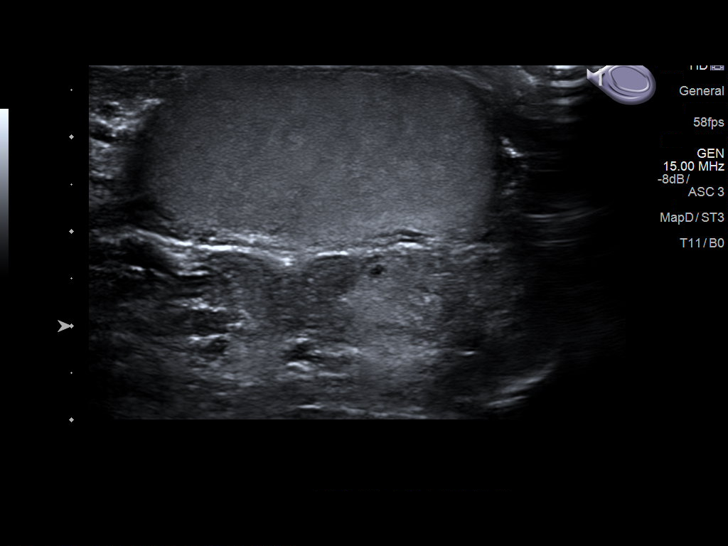

[14 of 25 positions shown; findings below may reference images not displayed]

FINDINGS: Right testicle

Measurements: 5 x 1.7 x 2.7 cm. Normal echotexture. No hyperemia. No
mass or microlithiasis visualized.

Left testicle

Measurements: 4.4 x 2.1 x 3.1 cm. Normal echotexture. No hyperemia.
No mass or microlithiasis visualized.

Right epididymis:  Heterogenous echotexture without hyperemia.

Left epididymis: Heterogenous echotexture without hyperemia. 1 x
x 0.9 cm solid-appearing slightly hyperechoic nodule in the tail,
not seen on previous exam.

Hydrocele:  None visualized.

Varicocele:  None convincingly demonstrated.

Pulsed Doppler interrogation of both testes demonstrates normal low
resistance arterial and venous waveforms bilaterally.
IMPRESSION: 1. Negative for testicular mass or torsion.
2. Left epididymal tail 1 cm nodule. Consider urologic consultation.

## 2017-10-29 ENCOUNTER — Encounter: Payer: Self-pay | Admitting: Emergency Medicine

## 2017-10-29 ENCOUNTER — Other Ambulatory Visit: Payer: Self-pay

## 2017-10-29 ENCOUNTER — Ambulatory Visit
Admission: EM | Admit: 2017-10-29 | Discharge: 2017-10-29 | Disposition: A | Discharge status: Home / Self Care | Payer: BLUE CROSS/BLUE SHIELD | Attending: Family Medicine | Admitting: Family Medicine

## 2017-10-29 DIAGNOSIS — J069 Acute upper respiratory infection, unspecified: Secondary | ICD-10-CM

## 2017-10-29 MED ORDER — FLUTICASONE PROPIONATE 50 MCG/ACT NA SUSP: 2.0000 | Freq: Every day | NASAL | 0 refills | Status: DC

## 2017-10-29 MED ORDER — HYDROCOD POLST-CPM POLST ER 10-8 MG/5ML PO SUER: 5.0000 mL | Freq: Two times a day (BID) | ORAL | 0 refills | Status: DC

## 2017-10-29 MED ORDER — BENZONATATE 200 MG PO CAPS: ORAL_CAPSULE | ORAL | 0 refills | Status: DC

## 2017-10-29 MED ORDER — AZITHROMYCIN 250 MG PO TABS: 250.0000 mg | ORAL_TABLET | Freq: Every day | ORAL | 0 refills | Status: DC

## 2017-10-29 NOTE — ED Triage Notes (Signed)
Patient in today c/o cough, chest congestion and nasal congestion x 1 week. Patient has had chills, but hasn't taken his temperature.

## 2017-10-29 NOTE — ED Provider Notes (Signed)
[facility]    CSN: [id number] Arrival date & time: 10/29/17  1824     History   Chief Complaint Chief Complaint  Patient presents with  . Cough    HPI Terry Shaffer is a 37 y.o. male.   HPI  37 year old male well-known to our clinic presents today with cough chest congestion nasal congestion for 1 week.  He has had chills but is currently not having fever.  He returned from a trip to [place] and came back with the symptoms.  Seen with first similar symptoms in March.       Past Medical History:  Diagnosis Date  . Kidney stones     There are no active problems to display for this patient.   Past Surgical History:  Procedure Laterality Date  . HERNIA REPAIR    . PARATHYROIDECTOMY    . VASECTOMY Bilateral 10/12/14       Home Medications    Prior to Admission medications   Medication Sig Start Date End Date Taking? Authorizing Provider  azithromycin (ZITHROMAX) 250 MG tablet Take 1 tablet (250 mg total) by mouth daily. Take first 2 tablets together, then 1 every day until finished. 10/29/17   Lutricia Feiloemer, William P, PA-C  benzonatate (TESSALON) 200 MG capsule Take one cap TID PRN cough 10/29/17   Lutricia Feiloemer, William P, PA-C  chlorpheniramine-HYDROcodone [facility](TUSSIONEX PENNKINETIC ER) 10-8 MG/5ML SUER Take 5 mLs by mouth 2 (two) times daily. 10/29/17   Lutricia Feiloemer, William P, PA-C  fluticasone (FLONASE) 50 MCG/ACT nasal spray Place 2 sprays into both nostrils daily. 10/29/17   Lutricia Feiloemer, William P, PA-C    Family History Family History  Problem Relation Age of Onset  . Cancer - Colon Mother   . Breast cancer Mother   . Hypertension Father     Social History Social History   Tobacco Use  . Smoking status: Former Smoker    Packs/day: 0.25    Types: E-cigarettes, Cigarettes    Last attempt to quit: 10/29/2016    Years since quitting: 1.0  . Smokeless tobacco: Never Used  . Tobacco comment: Vape  Substance Use Topics  . Alcohol use: No    Alcohol/week:  0.0 oz  . Drug use: No     Allergies   Diphenhydramine and Doxycycline   Review of Systems Review of Systems  Constitutional: Positive for activity change and chills. Negative for diaphoresis, fatigue and fever.  HENT: Positive for congestion and postnasal drip.   Respiratory: Positive for cough.   All other systems reviewed and are negative.    Physical Exam Triage Vital Signs ED Triage Vitals [10/29/17 1832]  Enc Vitals Group     BP 125/88     Pulse Rate 84     Resp 16     Temp 98.3 F (36.8 C)     Temp Source Oral     SpO2 99 %     Weight 215 lb (97.5 kg)     Height 5\' 11"  (1.803 m)     Head Circumference      Peak Flow      Pain Score 4     Pain Loc      Pain Edu?      Excl. in GC?    No data found.  Updated Vital Signs BP 125/88 (BP Location: Left Arm)   Pulse 84   Temp 98.3 F (36.8 C) (Oral)   Resp 16   Ht 5\' 11"  (1.803 m)   Wt  215 lb (97.5 kg)   SpO2 99%   BMI 29.99 kg/m   Visual Acuity Right Eye Distance:   Left Eye Distance:   Bilateral Distance:    Right Eye Near:   Left Eye Near:    Bilateral Near:     Physical Exam  Constitutional: He is oriented to person, place, and time. He appears well-developed and well-nourished. No distress.  HENT:  Head: Normocephalic.  Right Ear: External ear normal.  Left Ear: External ear normal.  Mouth/Throat: Oropharynx is clear and moist. No oropharyngeal exudate.  Examination shows mucas bilateral with swelling of the turbinates  Eyes: Pupils are equal, round, and reactive to light. Right eye exhibits no discharge. Left eye exhibits no discharge.  Neck: Normal range of motion.  Pulmonary/Chest: Effort normal and breath sounds normal.  Musculoskeletal: Normal range of motion.  Lymphadenopathy:    He has no cervical adenopathy.  Neurological: He is alert and oriented to person, place, and time.  Skin: Skin is warm and dry. He is not diaphoretic.  Psychiatric: He has a normal mood and affect. His  behavior is normal. Judgment and thought content normal.  Nursing note and vitals reviewed.    UC Treatments / Results  Labs (all labs ordered are listed, but only abnormal results are displayed) Labs Reviewed - No data to display  EKG None  Radiology No results found.  Procedures Procedures (including critical care time)  Medications Ordered in UC Medications - No data to display  Initial Impression / Assessment and Plan / UC Course  I have reviewed the triage vital signs and the nursing notes.  Pertinent labs & imaging results that were available during my care of the patient were reviewed by me and considered in my medical decision making (see chart for details).     Plan: 1. Test/x-ray results and diagnosis reviewed with patient 2. rx as per orders; risks, benefits, potential side effects reviewed with patient 3. Recommend supportive treatment with an Ace daily for the next 3 to 4 weeks.  Treat cough symptomatically.  His second episode of very similar findings.  We will start him on azithromycin for his active cough.  He is not improving he should follow-up at the primary care facility or may return to our clinic 4. F/u prn if symptoms worsen or don't improve  Final Clinical Impressions(s) / UC Diagnoses   Final diagnoses:  Upper respiratory tract infection, unspecified type   Discharge Instructions   None    ED Prescriptions    Medication Sig Dispense Auth. Provider   azithromycin (ZITHROMAX) 250 MG tablet Take 1 tablet (250 mg total) by mouth daily. Take first 2 tablets together, then 1 every day until finished. 6 tablet Lutricia Feil, PA-C   chlorpheniramine-HYDROcodone (TUSSIONEX PENNKINETIC ER) 10-8 MG/5ML SUER Take 5 mLs by mouth 2 (two) times daily. 115 mL Ovid Curd P, PA-C   benzonatate (TESSALON) 200 MG capsule Take one cap TID PRN cough 30 capsule Ovid Curd P, PA-C   fluticasone (FLONASE) 50 MCG/ACT nasal spray Place 2 sprays into both  nostrils daily. 16 g Lutricia Feil, PA-C     Controlled Substance Prescriptions [place] Controlled Substance Registry consulted? Not Applicable   Lutricia Feil, PA-C 10/29/17 2027

## 2017-11-02 ENCOUNTER — Other Ambulatory Visit: Payer: Self-pay

## 2017-11-02 ENCOUNTER — Ambulatory Visit
Admission: EM | Admit: 2017-11-02 | Discharge: 2017-11-02 | Disposition: A | Discharge status: Home / Self Care | Payer: BLUE CROSS/BLUE SHIELD | Attending: Family Medicine | Admitting: Family Medicine

## 2017-11-02 DIAGNOSIS — J069 Acute upper respiratory infection, unspecified: Secondary | ICD-10-CM | POA: Diagnosis not present

## 2017-11-02 DIAGNOSIS — J40 Bronchitis, not specified as acute or chronic: Secondary | ICD-10-CM

## 2017-11-02 MED ORDER — HYDROCOD POLST-CPM POLST ER 10-8 MG/5ML PO SUER: 5.0000 mL | Freq: Two times a day (BID) | ORAL | 0 refills | Status: DC

## 2017-11-02 NOTE — ED Triage Notes (Signed)
Patient states that he was seen here last week. Cough x 2 weeks now. Patient reports that symptoms have seem to worsen and has settled in his chest. Still taking Azithromycin and cough medications.

## 2017-11-02 NOTE — ED Provider Notes (Signed)
[facility]    CSN: [id number] Arrival date & time: 11/02/17  1130     History   Chief Complaint Chief Complaint  Patient presents with  . Cough    HPI Natale MilchBryan Anthony Ostrum is a 37 y.o. male.   HPI  37 year old male returns today 4 days after being seen for an upper respiratory infection.  He was given a prescription for azithromycin at that time along with cough suppressants.  Despite that he continues to have symptoms that seem to be worsening.  He states he was at work today and they sent him home because of his coughing and they did not want him to spread the illness.  He also brings his 38 year old daughter who has the same symptoms.  Feels as though the phlegm is stuck in his chest and he bringing up to  expectorated.  He does not have any fever or chills.       Past Medical History:  Diagnosis Date  . Kidney stones     There are no active problems to display for this patient.   Past Surgical History:  Procedure Laterality Date  . HERNIA REPAIR    . PARATHYROIDECTOMY    . VASECTOMY Bilateral 10/12/14       Home Medications    Prior to Admission medications   Medication Sig Start Date End Date Taking? Authorizing Provider  azithromycin (ZITHROMAX) 250 MG tablet Take 1 tablet (250 mg total) by mouth daily. Take first 2 tablets together, then 1 every day until finished. 10/29/17  Yes Lutricia Feiloemer, Taz Vanness P, PA-C  benzonatate (TESSALON) 200 MG capsule Take one cap TID PRN cough 10/29/17  Yes Ovid Curdoemer, Ellenora Talton P, PA-C  fluticasone (FLONASE) 50 MCG/ACT nasal spray Place 2 sprays into both nostrils daily. 10/29/17  Yes Lutricia Feiloemer, Ly Bacchi P, PA-C  chlorpheniramine-HYDROcodone (TUSSIONEX PENNKINETIC ER) 10-8 MG/5ML SUER Take 5 mLs by mouth 2 (two) times daily. 11/02/17   Lutricia Feiloemer, Brantlee Penn P, PA-C    Family History Family History  Problem Relation Age of Onset  . Cancer - Colon Mother   . Breast cancer Mother   . Hypertension Father     Social History Social  History   Tobacco Use  . Smoking status: Former Smoker    Packs/day: 0.25    Types: E-cigarettes, Cigarettes    Last attempt to quit: 10/29/2016    Years since quitting: 1.0  . Smokeless tobacco: Never Used  . Tobacco comment: Vape  Substance Use Topics  . Alcohol use: No    Alcohol/week: 0.0 oz  . Drug use: No     Allergies   Diphenhydramine and Doxycycline   Review of Systems Review of Systems  Constitutional: Positive for activity change. Negative for appetite change, chills, fatigue and fever.  HENT: Positive for congestion, sinus pressure and sinus pain.   Respiratory: Positive for cough.   All other systems reviewed and are negative.    Physical Exam Triage Vital Signs ED Triage Vitals [11/02/17 1153]  Enc Vitals Group     BP 133/90     Pulse Rate 82     Resp 17     Temp 98.4 F (36.9 C)     Temp Source Oral     SpO2 100 %     Weight 215 lb (97.5 kg)     Height 5\' 11"  (1.803 m)     Head Circumference      Peak Flow      Pain Score 5  Pain Loc      Pain Edu?      Excl. in GC?    No data found.  Updated Vital Signs BP 133/90 (BP Location: Left Arm)   Pulse 82   Temp 98.4 F (36.9 C) (Oral)   Resp 17   Ht 5\' 11"  (1.803 m)   Wt 215 lb (97.5 kg)   SpO2 100%   BMI 29.99 kg/m   Visual Acuity Right Eye Distance:   Left Eye Distance:   Bilateral Distance:    Right Eye Near:   Left Eye Near:    Bilateral Near:     Physical Exam  Constitutional: He is oriented to person, place, and time. He appears well-developed and well-nourished. No distress.  HENT:  Head: Normocephalic.  Right Ear: External ear normal.  Left Ear: External ear normal.  Nose: Nose normal.  Mouth/Throat: Oropharynx is clear and moist. No oropharyngeal exudate.  Eyes: Pupils are equal, round, and reactive to light. Right eye exhibits no discharge. Left eye exhibits no discharge.  Neck: Normal range of motion.  Pulmonary/Chest: Effort normal and breath sounds normal.    Musculoskeletal: Normal range of motion.  Lymphadenopathy:    He has no cervical adenopathy.  Neurological: He is alert and oriented to person, place, and time.  Skin: Skin is warm and dry. He is not diaphoretic.  Psychiatric: He has a normal mood and affect. His behavior is normal. Judgment and thought content normal.  Nursing note and vitals reviewed.    UC Treatments / Results  Labs (all labs ordered are listed, but only abnormal results are displayed) Labs Reviewed - No data to display  EKG None  Radiology No results found.  Procedures Procedures (including critical care time)  Medications Ordered in UC Medications - No data to display  Initial Impression / Assessment and Plan / UC Course  I have reviewed the triage vital signs and the nursing notes.  Pertinent labs & imaging results that were available during my care of the patient were reviewed by me and considered in my medical decision making (see chart for details).     Plan: 1. Test/x-ray results and diagnosis reviewed with patient 2. rx as per orders; risks, benefits, potential side effects reviewed with patient 3. Recommend supportive treatment with continued hydration and use of the cough suppressants.  We will refill his Tussionex 1 more time.  Told him that this is likely a viral illness since he has not reacted to the azithromycin.  States his last pill.  Take time and has to run its course.  He is reassured.  Since his company told him to go home today I will give him to the office in tomorrow and should be able to return the following day. 4. F/u prn if symptoms worsen or don't improve  Final Clinical Impressions(s) / UC Diagnoses   Final diagnoses:  Upper respiratory tract infection, unspecified type  Bronchitis   Discharge Instructions   None    ED Prescriptions    Medication Sig Dispense Auth. Provider   chlorpheniramine-HYDROcodone (TUSSIONEX PENNKINETIC ER) 10-8 MG/5ML SUER Take 5 mLs by  mouth 2 (two) times daily. 115 mL Lutricia Feil, PA-C     Controlled Substance Prescriptions [place] Controlled Substance Registry consulted? Not Applicable   Lutricia Feil, PA-C 11/02/17 1425

## 2017-12-08 ENCOUNTER — Emergency Department
Admission: EM | Admit: 2017-12-08 | Discharge: 2017-12-08 | Disposition: A | Discharge status: Home / Self Care | Payer: BLUE CROSS/BLUE SHIELD | Attending: Student in an Organized Health Care Education/Training Program | Admitting: Student in an Organized Health Care Education/Training Program

## 2017-12-08 ENCOUNTER — Other Ambulatory Visit: Payer: Self-pay

## 2017-12-08 ENCOUNTER — Encounter: Payer: Self-pay | Admitting: Emergency Medicine

## 2017-12-08 ENCOUNTER — Emergency Department: Payer: BLUE CROSS/BLUE SHIELD

## 2017-12-08 DIAGNOSIS — Z87891 Personal history of nicotine dependence: Secondary | ICD-10-CM | POA: Insufficient documentation

## 2017-12-08 DIAGNOSIS — R1031 Right lower quadrant pain: Secondary | ICD-10-CM | POA: Diagnosis present

## 2017-12-08 DIAGNOSIS — N200 Calculus of kidney: Secondary | ICD-10-CM | POA: Diagnosis not present

## 2017-12-08 DIAGNOSIS — Z79899 Other long term (current) drug therapy: Secondary | ICD-10-CM | POA: Diagnosis not present

## 2017-12-08 LAB — COMPREHENSIVE METABOLIC PANEL
ALK PHOS: 99 U/L (ref 38–126)
ALT: 50 U/L — ABNORMAL HIGH (ref 0–44)
ANION GAP: 7 (ref 5–15)
AST: 32 U/L (ref 15–41)
Albumin: 4.7 g/dL (ref 3.5–5.0)
BUN: 14 mg/dL (ref 6–20)
CALCIUM: 9.5 mg/dL (ref 8.9–10.3)
CHLORIDE: 105 mmol/L (ref 98–111)
CO2: 27 mmol/L (ref 22–32)
Creatinine, Ser: 0.92 mg/dL (ref 0.61–1.24)
GFR calc Af Amer: >60 mL/min (ref >60)
GFR calc non Af Amer: >60 mL/min (ref >60)
Glucose, Bld: 94 mg/dL (ref 70–99)
POTASSIUM: 4 mmol/L (ref 3.5–5.1)
SODIUM: 139 mmol/L (ref 135–145)
Total Bilirubin: 0.6 mg/dL (ref 0.3–1.2)
Total Protein: 7.6 g/dL (ref 6.5–8.1)

## 2017-12-08 LAB — CBC WITH DIFFERENTIAL/PLATELET
BASOS PCT: 1 %
Basophils Absolute: 0.1 10*3/uL (ref 0–0.1)
Eosinophils Absolute: 0.1 10*3/uL (ref 0–0.7)
Eosinophils Relative: 1 %
HEMATOCRIT: 42.6 % (ref 40.0–52.0)
HEMOGLOBIN: 14.1 g/dL (ref 13.0–18.0)
LYMPHS ABS: 2.8 10*3/uL (ref 1.0–3.6)
Lymphocytes Relative: 32 %
MCH: 26.7 pg (ref 26.0–34.0)
MCHC: 33.2 g/dL (ref 32.0–36.0)
MCV: 80.4 fL (ref 80.0–100.0)
Monocytes Absolute: 0.7 10*3/uL (ref 0.2–1.0)
Monocytes Relative: 8 %
NEUTROS ABS: 5.2 10*3/uL (ref 1.4–6.5)
NEUTROS PCT: 58 %
Platelets: 226 10*3/uL (ref 150–440)
RBC: 5.3 MIL/uL (ref 4.40–5.90)
RDW: 14.3 % (ref 11.5–14.5)
WBC: 8.8 10*3/uL (ref 3.8–10.6)

## 2017-12-08 LAB — URINALYSIS, COMPLETE (UACMP) WITH MICROSCOPIC
BACTERIA UA: NONE SEEN
BILIRUBIN URINE: NEGATIVE
Glucose, UA: NEGATIVE mg/dL
KETONES UR: NEGATIVE mg/dL
Nitrite: NEGATIVE
PROTEIN: NEGATIVE mg/dL
Specific Gravity, Urine: 1.012 (ref 1.005–1.030)
Squamous Epithelial / LPF: NONE SEEN (ref 0–5)
pH: 8 (ref 5.0–8.0)

## 2017-12-08 MED ORDER — ONDANSETRON HCL 4 MG/2ML IJ SOLN
4.0000 mg | Freq: Once | INTRAMUSCULAR | Status: AC
  Administered 2017-12-08: 4 mg via INTRAVENOUS
  Filled 2017-12-08: qty 2

## 2017-12-08 MED ORDER — OXYCODONE-ACETAMINOPHEN 10-325 MG PO TABS: 1.0000 | ORAL_TABLET | Freq: Four times a day (QID) | ORAL | 0 refills | Status: AC | PRN

## 2017-12-08 MED ORDER — CIPROFLOXACIN HCL 500 MG PO TABS: 500.0000 mg | ORAL_TABLET | Freq: Two times a day (BID) | ORAL | 0 refills | Status: DC

## 2017-12-08 MED ORDER — SODIUM CHLORIDE 0.9 % IV BOLUS
1000.0000 mL | Freq: Once | INTRAVENOUS | Status: AC
  Administered 2017-12-08: 1000 mL via INTRAVENOUS

## 2017-12-08 MED ORDER — TAMSULOSIN HCL 0.4 MG PO CAPS: 0.4000 mg | ORAL_CAPSULE | Freq: Every day | ORAL | 0 refills | Status: DC

## 2017-12-08 MED ORDER — KETOROLAC TROMETHAMINE 30 MG/ML IJ SOLN
15.0000 mg | Freq: Once | INTRAMUSCULAR | Status: AC
  Administered 2017-12-08: 15 mg via INTRAVENOUS
  Filled 2017-12-08: qty 1

## 2017-12-08 NOTE — ED Notes (Signed)
Pt to the er for pain to the right flank. Pt has a hx of kidney stones and overproduction of calcium. Pt had parathyroid gland removed. Pt last stone was 3 years ago and last CT at the same time. Pt used to see Dr Achilles Dunkope.

## 2017-12-08 NOTE — ED Notes (Signed)
Pt to CT

## 2017-12-08 NOTE — ED Triage Notes (Signed)
Pt to ED via POV with c/o RT sided flank pain started today. Denies any injuries, denies dysuria. Pt ambulatory, VSS

## 2017-12-08 NOTE — ED Notes (Signed)
Blood sent to lab

## 2017-12-08 NOTE — ED Provider Notes (Signed)
[facility] Emergency Department Provider Note  ____________________________________________  Time seen: Approximately 6:18 PM  I have reviewed the triage vital signs and the nursing notes.   HISTORY  Chief Complaint Flank Pain    HPI Terry Shaffer is a 37 y.o. male who presents to the emergency department for treatment and evaluation of right side flank pain that started today. 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 Prior to surgery, he had multiple kidney stones but has not had any issues since. Flank pain feels similar to previous kidney stones. He has required a renal stent in the past. He denies dysuria or hematuria.  He has felt nauseated. He denies vomiting, or fever.  Past Medical History:  Diagnosis Date  . Kidney stones     There are no active problems to display for this patient.   Past Surgical History:  Procedure Laterality Date  . HERNIA REPAIR    . PARATHYROIDECTOMY    . VASECTOMY Bilateral 10/12/14    Prior to Admission medications   Medication Sig Start Date End Date Taking? Authorizing Provider  azithromycin (ZITHROMAX) 250 MG tablet Take 1 tablet (250 mg total) by mouth daily. Take first 2 tablets together, then 1 every day until finished. 10/29/17   Lutricia Feiloemer, William P, PA-C  benzonatate (TESSALON) 200 MG capsule Take one cap TID PRN cough 10/29/17   Lutricia Feiloemer, William P, PA-C  chlorpheniramine-HYDROcodone [facility](TUSSIONEX PENNKINETIC ER) 10-8 MG/5ML SUER Take 5 mLs by mouth 2 (two) times daily. 11/02/17   Lutricia Feiloemer, William P, PA-C  ciprofloxacin (CIPRO) 500 MG tablet Take 1 tablet (500 mg total) by mouth 2 (two) times daily. 12/08/17   Nasiir Monts B, FNP  fluticasone (FLONASE) 50 MCG/ACT nasal spray Place 2 sprays into both nostrils daily. 10/29/17   Lutricia Feiloemer, William P, PA-C  oxyCODONE-acetaminophen (PERCOCET) 10-325 MG tablet Take 1 tablet by mouth every 6 (six) hours as needed for up to 3 days for pain. 12/08/17  12/11/17  Elkin Belfield, Rulon Eisenmengerari B, FNP  tamsulosin (FLOMAX) 0.4 MG CAPS capsule Take 1 capsule (0.4 mg total) by mouth daily. 12/08/17   Esa Raden, Rulon Eisenmengerari B, FNP    Allergies Diphenhydramine and Doxycycline  Family History  Problem Relation Age of Onset  . Cancer - Colon Mother   . Breast cancer Mother   . Hypertension Father     Social History Social History   Tobacco Use  . Smoking status: Former Smoker    Packs/day: 0.25    Types: E-cigarettes, Cigarettes    Last attempt to quit: 10/29/2016    Years since quitting: 1.1  . Smokeless tobacco: Never Used  . Tobacco comment: Vape  Substance Use Topics  . Alcohol use: No    Alcohol/week: 0.0 oz  . Drug use: No    Review of Systems Constitutional: Negative for fever. Respiratory: Negative for shortness of breath or cough. Gastrointestinal: Negative for abdominal pain; negative for nausea , negative for vomiting. Genitourinary: Negative for dysuria , negative for penile discharge. Musculoskeletal: Positive for CVA tenderness on the right. Skin: Negative for acute skin changes/rash/lesion. ____________________________________________   PHYSICAL EXAM:  VITAL SIGNS: ED Triage Vitals  Enc Vitals Group     BP 12/08/17 1755 (!) 145/95     Pulse Rate 12/08/17 1755 75     Resp 12/08/17 1755 18     Temp 12/08/17 1755 98.2 F (36.8 C)     Temp Source 12/08/17 1755 Oral     SpO2 12/08/17 1755 100 %  Weight 12/08/17 1755 223 lb (101.2 kg)     Height 12/08/17 1755 5\' 11"  (1.803 m)     Head Circumference --      Peak Flow --      Pain Score 12/08/17 1757 8     Pain Loc --      Pain Edu? --      Excl. in GC? --     Constitutional: Alert and oriented. Well appearing and in no acute distress. Eyes: Conjunctivae are normal. Head: Atraumatic. Nose: No congestion/rhinnorhea. Mouth/Throat: Mucous membranes are moist. Respiratory: Normal respiratory effort.  No retractions. Gastrointestinal: Bowel sounds active x 4; Abdomen is soft  without rebound or guarding. Genitourinary: exam deferred. Musculoskeletal: No extremity tenderness nor edema. CVA tenderness on the right flank. Neurologic:  Normal speech and language. No gross focal neurologic deficits are appreciated. Speech is normal. No gait instability. Skin:  Skin is warm, dry and intact. No rash noted on exposed skin. Psychiatric: Mood and affect are normal. Speech and behavior are normal.  ____________________________________________   LABS (all labs ordered are listed, but only abnormal results are displayed)  Labs Reviewed  URINALYSIS, COMPLETE (UACMP) WITH MICROSCOPIC - Abnormal; Notable for the following components:      Result Value   Color, Urine STRAW (*)    APPearance CLEAR (*)    Hgb urine dipstick SMALL (*)    Leukocytes, UA TRACE (*)    All other components within normal limits  COMPREHENSIVE METABOLIC PANEL - Abnormal; Notable for the following components:   ALT 50 (*)    All other components within normal limits  CBC WITH DIFFERENTIAL/PLATELET   ____________________________________________  RADIOLOGY  Bilateral nonobstructive nephrolithiasis without hydronephrosis or renal obstruction noted on CT of the abdomen and pelvis per radiology. ____________________________________________  Procedures  ____________________________________________  37 year old male presenting to the emergency department for treatment and evaluation of right flank pain. He has a longstanding history of nephrolithiasis that has required stenting in the past. Parathyroidectomy had controlled calcium level and subsequently he has had no more stones. Symptoms and exam are concerning for stone today. Labs, urinalysis, and CT ordered.  ----------------------------------------- 8:22 PM on 12/08/2017 -----------------------------------------  Patient to be discharged home with Flomax, Cipro, and Percocet.  He is to schedule follow-up appointment with urology for symptoms  that have not improved over a week.  He was encouraged to return to the emergency department for symptoms of change or worsen if unable to see primary care or the specialist.  INITIAL IMPRESSION / ASSESSMENT AND PLAN / ED COURSE  Pertinent labs & imaging results that were available during my care of the patient were reviewed by me and considered in my medical decision making (see chart for details).  ____________________________________________   FINAL CLINICAL IMPRESSION(S) / ED DIAGNOSES  Final diagnoses:  Nephrolithiasis    Note:  This document was prepared using Dragon voice recognition software and may include unintentional dictation errors.    Chinita Pester, FNP 12/08/17 2022    Willy Eddy, MD 12/08/17 2045

## 2018-03-03 ENCOUNTER — Ambulatory Visit
Admission: EM | Admit: 2018-03-03 | Discharge: 2018-03-03 | Disposition: A | Discharge status: Home / Self Care | Payer: BLUE CROSS/BLUE SHIELD | Attending: Family Medicine | Admitting: Family Medicine

## 2018-03-03 ENCOUNTER — Encounter: Payer: Self-pay | Admitting: Emergency Medicine

## 2018-03-03 ENCOUNTER — Other Ambulatory Visit: Payer: Self-pay

## 2018-03-03 DIAGNOSIS — X58XXXA Exposure to other specified factors, initial encounter: Secondary | ICD-10-CM

## 2018-03-03 DIAGNOSIS — S39012A Strain of muscle, fascia and tendon of lower back, initial encounter: Secondary | ICD-10-CM

## 2018-03-03 MED ORDER — CYCLOBENZAPRINE HCL 5 MG PO TABS: 10.0000 mg | ORAL_TABLET | Freq: Every day | ORAL | 0 refills | Status: DC

## 2018-03-03 MED ORDER — KETOROLAC TROMETHAMINE 10 MG PO TABS: 10.0000 mg | ORAL_TABLET | Freq: Three times a day (TID) | ORAL | 0 refills | Status: DC | PRN

## 2018-03-03 NOTE — ED Triage Notes (Signed)
Pt c/o lower back pain. Started 2 days ago. No known injury. Worse with movement.

## 2018-03-03 NOTE — ED Provider Notes (Signed)
[facility]    CSN: [id number] Arrival date & time: 03/03/18  1744     History   Chief Complaint Chief Complaint  Patient presents with  . Back Pain    HPI Terry Shaffer is a 37 y.o. male.   The history is provided by the patient.  Back Pain  Location:  Lumbar spine Quality:  Aching Radiates to:  Does not radiate Pain severity:  Moderate Pain is:  Same all the time Duration:  2 days Timing:  Constant Progression:  Worsening Chronicity:  New Context comment:  Unknown trigger Relieved by:  Nothing Ineffective treatments:  NSAIDs Associated symptoms: no abdominal pain, no abdominal swelling, no bladder incontinence, no bowel incontinence, no chest pain, no dysuria, no fever, no headaches, no leg pain, no numbness, no paresthesias, no pelvic pain, no perianal numbness, no tingling, no weakness and no weight loss     Past Medical History:  Diagnosis Date  . Kidney stones     There are no active problems to display for this patient.   Past Surgical History:  Procedure Laterality Date  . HERNIA REPAIR    . PARATHYROIDECTOMY    . VASECTOMY Bilateral 10/12/14       Home Medications    Prior to Admission medications   Medication Sig Start Date End Date Taking? Authorizing Provider  azithromycin (ZITHROMAX) 250 MG tablet Take 1 tablet (250 mg total) by mouth daily. Take first 2 tablets together, then 1 every day until finished. 10/29/17   Lutricia Feil, PA-C  benzonatate (TESSALON) 200 MG capsule Take one cap TID PRN cough 10/29/17   Lutricia Feil, PA-C  chlorpheniramine-HYDROcodone [facility] ER) 10-8 MG/5ML SUER Take 5 mLs by mouth 2 (two) times daily. 11/02/17   Lutricia Feil, PA-C  ciprofloxacin (CIPRO) 500 MG tablet Take 1 tablet (500 mg total) by mouth 2 (two) times daily. 12/08/17   Triplett, Rulon Eisenmenger B, FNP  cyclobenzaprine (FLEXERIL) 5 MG tablet Take 2 tablets (10 mg total) by mouth at bedtime. 03/03/18   Payton Mccallum, MD   fluticasone (FLONASE) 50 MCG/ACT nasal spray Place 2 sprays into both nostrils daily. 10/29/17   Lutricia Feil, PA-C  ketorolac (TORADOL) 10 MG tablet Take 1 tablet (10 mg total) by mouth every 8 (eight) hours as needed. 03/03/18   Payton Mccallum, MD  tamsulosin (FLOMAX) 0.4 MG CAPS capsule Take 1 capsule (0.4 mg total) by mouth daily. 12/08/17   Chinita Pester, FNP    Family History Family History  Problem Relation Age of Onset  . Cancer - Colon Mother   . Breast cancer Mother   . Hypertension Father     Social History Social History   Tobacco Use  . Smoking status: Former Smoker    Packs/day: 0.25    Types: E-cigarettes, Cigarettes    Last attempt to quit: 10/29/2016    Years since quitting: 1.3  . Smokeless tobacco: Never Used  . Tobacco comment: Vape  Substance Use Topics  . Alcohol use: No    Alcohol/week: 0.0 standard drinks  . Drug use: No     Allergies   Diphenhydramine and Doxycycline   Review of Systems Review of Systems  Constitutional: Negative for fever and weight loss.  Cardiovascular: Negative for chest pain.  Gastrointestinal: Negative for abdominal pain and bowel incontinence.  Genitourinary: Negative for bladder incontinence, dysuria and pelvic pain.  Musculoskeletal: Positive for back pain.  Neurological: Negative for tingling, weakness, numbness, headaches and paresthesias.  Physical Exam Triage Vital Signs ED Triage Vitals  Enc Vitals Group     BP 03/03/18 1826 133/85     Pulse Rate 03/03/18 1826 79     Resp 03/03/18 1826 17     Temp 03/03/18 1826 98.2 F (36.8 C)     Temp Source 03/03/18 1826 Oral     SpO2 03/03/18 1826 99 %     Weight 03/03/18 1823 220 lb (99.8 kg)     Height 03/03/18 1823 5\' 11"  (1.803 m)     Head Circumference --      Peak Flow --      Pain Score 03/03/18 1823 10     Pain Loc --      Pain Edu? --      Excl. in GC? --    No data found.  Updated Vital Signs BP 133/85 (BP Location: Left Arm)   Pulse  79   Temp 98.2 F (36.8 C) (Oral)   Resp 17   Ht 5\' 11"  (1.803 m)   Wt 99.8 kg   SpO2 99%   BMI 30.68 kg/m   Visual Acuity Right Eye Distance:   Left Eye Distance:   Bilateral Distance:    Right Eye Near:   Left Eye Near:    Bilateral Near:     Physical Exam  Constitutional: He appears well-developed and well-nourished. No distress.  Neck: Normal range of motion. Neck supple. No tracheal deviation present.  Pulmonary/Chest: Effort normal. No stridor. No respiratory distress.  Musculoskeletal:       Lumbar back: He exhibits tenderness (over the left lumbar paraspinous muscles) and spasm. He exhibits normal range of motion, no bony tenderness, no swelling, no edema, no deformity, no laceration, no pain and normal pulse.  Neurological: He is alert. He has normal reflexes. He displays normal reflexes. He exhibits normal muscle tone. Coordination normal.  Skin: No rash noted. He is not diaphoretic.  Nursing note and vitals reviewed.    UC Treatments / Results  Labs (all labs ordered are listed, but only abnormal results are displayed) Labs Reviewed - No data to display  EKG None  Radiology No results found.  Procedures Procedures (including critical care time)  Medications Ordered in UC Medications - No data to display  Initial Impression / Assessment and Plan / UC Course  I have reviewed the triage vital signs and the nursing notes.  Pertinent labs & imaging results that were available during my care of the patient were reviewed by me and considered in my medical decision making (see chart for details).      Final Clinical Impressions(s) / UC Diagnoses   Final diagnoses:  Strain of lumbar region, initial encounter    ED Prescriptions    Medication Sig Dispense Auth. Provider   cyclobenzaprine (FLEXERIL) 5 MG tablet Take 2 tablets (10 mg total) by mouth at bedtime. 30 tablet Payton Mccallum, MD   ketorolac (TORADOL) 10 MG tablet Take 1 tablet (10 mg total)  by mouth every 8 (eight) hours as needed. 15 tablet Payton Mccallum, MD     1. Labs/x-ray results and diagnosis reviewed with patient  2. rx as per orders above; reviewed possible side effects, interactions, risks and benefits  3. Recommend supportive treatment with heat to area, rest, stretch 4. Follow-up prn if symptoms worsen or don't improve   Controlled Substance Prescriptions [place] Controlled Substance Registry consulted? Not Applicable   Payton Mccallum, MD 03/03/18 [phone]

## 2018-03-29 ENCOUNTER — Encounter: Payer: Self-pay | Admitting: Emergency Medicine

## 2018-03-29 ENCOUNTER — Other Ambulatory Visit: Payer: Self-pay

## 2018-03-29 ENCOUNTER — Ambulatory Visit
Admission: EM | Admit: 2018-03-29 | Discharge: 2018-03-29 | Disposition: A | Discharge status: Home / Self Care | Payer: BLUE CROSS/BLUE SHIELD | Attending: Family Medicine | Admitting: Family Medicine

## 2018-03-29 DIAGNOSIS — R0981 Nasal congestion: Secondary | ICD-10-CM | POA: Diagnosis not present

## 2018-03-29 DIAGNOSIS — J029 Acute pharyngitis, unspecified: Secondary | ICD-10-CM

## 2018-03-29 DIAGNOSIS — J111 Influenza due to unidentified influenza virus with other respiratory manifestations: Secondary | ICD-10-CM

## 2018-03-29 DIAGNOSIS — R69 Illness, unspecified: Secondary | ICD-10-CM | POA: Diagnosis not present

## 2018-03-29 LAB — RAPID STREP SCREEN (MED CTR MEBANE ONLY): Streptococcus, Group A Screen (Direct): NEGATIVE

## 2018-03-29 MED ORDER — OSELTAMIVIR PHOSPHATE 75 MG PO CAPS: 75.0000 mg | ORAL_CAPSULE | Freq: Two times a day (BID) | ORAL | 0 refills | Status: DC

## 2018-03-29 NOTE — ED Provider Notes (Signed)
[facility] ____________________________________________  Time seen: Approximately 5:55 PM  I have reviewed the triage vital signs and the nursing notes.   HISTORY  Chief Complaint Generalized Body Aches; Headache; and Sore Throat  HPI Terry Shaffer is a 37 y.o. male presenting for evaluation of 2 days of nasal congestion, hacky cough, chills, body aches, subjective fever and sore throat.  Reports quick onset of symptoms.  States painful to swallow but has continued to overall eat and drink well.  Has not checked his temperature but felt like he has had a fever.  Has been taken over-the-counter DayQuil and NyQuil with some improvement, no resolution.  No over-the-counter medication taken just prior to arrival.  Denies other aggravating alleviating factors.  Reports otherwise doing well denies other complaints but denies recent sickness.  Denies chest pain, shortness of breath, abdominal pain.  Reports history of kidney stones.  Denies renal insufficiency.   Past Medical History:  Diagnosis Date  . Kidney stones     There are no active problems to display for this patient.   Past Surgical History:  Procedure Laterality Date  . HERNIA REPAIR    . PARATHYROIDECTOMY    . VASECTOMY Bilateral 10/12/14     No current facility-administered medications for this encounter.   Current Outpatient Medications:  Marland Kitchen  Multiple Vitamin (MULTIVITAMIN) tablet, Take 1 tablet by mouth daily., Disp: , Rfl:  .  oseltamivir (TAMIFLU) 75 MG capsule, Take 1 capsule (75 mg total) by mouth every 12 (twelve) hours., Disp: 10 capsule, Rfl: 0  Allergies Diphenhydramine and Doxycycline  Family History  Problem Relation Age of Onset  . Cancer - Colon Mother   . Breast cancer Mother   . Hypertension Father     Social History Social History   Tobacco Use  . Smoking status: Former Smoker    Packs/day: 0.25    Types: E-cigarettes, Cigarettes    Last attempt to quit: 10/29/2016   Years since quitting: 1.4  . Smokeless tobacco: Never Used  . Tobacco comment: Vape  Substance Use Topics  . Alcohol use: No    Alcohol/week: 0.0 standard drinks  . Drug use: No    Review of Systems Constitutional: subjective fevers.  ENT: positive sore throat. Cardiovascular: Denies chest pain. Respiratory: Denies shortness of breath. Gastrointestinal: No abdominal pain.  No nausea, no vomiting.  No diarrhea.  Genitourinary: Negative for dysuria. Musculoskeletal: Negative for back pain. Skin: Negative for rash.    ____________________________________________   PHYSICAL EXAM:  VITAL SIGNS: ED Triage Vitals  Enc Vitals Group     BP 03/29/18 1655 (!) 143/97     Pulse Rate 03/29/18 1655 100     Resp 03/29/18 1655 16     Temp 03/29/18 1655 98 F (36.7 C)     Temp Source 03/29/18 1655 Oral     SpO2 03/29/18 1655 100 %     Weight 03/29/18 1656 221 lb (100.2 kg)     Height 03/29/18 1656 5\' 11"  (1.803 m)     Head Circumference --      Peak Flow --      Pain Score 03/29/18 1655 8     Pain Loc --      Pain Edu? --      Excl. in GC? --     Constitutional: Alert and oriented. Well appearing and in no acute distress. Eyes: Conjunctivae are normal.  Head: Atraumatic. No sinus tenderness to palpation. No swelling. No erythema.  Ears: no erythema, normal  TMs bilaterally.   Nose:Nasal congestion   Mouth/Throat: Mucous membranes are moist. Mild pharyngeal erythema. No tonsillar swelling or exudate.  Neck: No stridor.  No cervical spine tenderness to palpation. Hematological/Lymphatic/Immunilogical: No cervical lymphadenopathy. Cardiovascular: Normal rate, regular rhythm. Grossly normal heart sounds.  Good peripheral circulation. Respiratory: Normal respiratory effort.  No retractions. No wheezes, rales or rhonchi. Good air movement.  Musculoskeletal: Ambulatory with steady gait. Neurologic:  Normal speech and language. No gait instability. Skin:  Skin appears warm, dry and  intact. No rash noted. Psychiatric: Mood and affect are normal. Speech and behavior are normal.  ___________________________________________   LABS (all labs ordered are listed, but only abnormal results are displayed)  Labs Reviewed  RAPID STREP SCREEN ([facility] ONLY)  CULTURE, GROUP A STREP [facility])    PROCEDURES Procedures   INITIAL IMPRESSION / ASSESSMENT AND PLAN / ED COURSE  Pertinent labs & imaging results that were available during my care of the patient were reviewed by me and considered in my medical decision making (see chart for details).  Overall well-appearing patient.  No acute distress.  Strep negative, will culture.  Suspect viral illness such as influenza.  Discussed use of Tamiflu, patient agrees.  Rx Tamiflu given.  Encourage rest, fluids, supportive care, over-the-counter Tylenol ibuprofen as needed.Discussed indication, risks and benefits of medications with patient.  Discussed follow up with Primary care physician this week. Discussed follow up and return parameters including no resolution or any worsening concerns. Patient verbalized understanding and agreed to plan.   ____________________________________________   FINAL CLINICAL IMPRESSION(S) / ED DIAGNOSES  Final diagnoses:  Influenza-like illness     ED Discharge Orders         Ordered    oseltamivir (TAMIFLU) 75 MG capsule  Every 12 hours     03/29/18 1802           Note: This dictation was prepared with Dragon dictation along with smaller phrase technology. Any transcriptional errors that result from this process are unintentional.         Renford Dills, NP 03/29/18 2040

## 2018-03-29 NOTE — ED Triage Notes (Signed)
Patient in today c/o body aches, headache, cough and sore throat x 2 days. Patient has felt feverish, but hasn't taken his temperature. Patient has tried OTC Dayquil and Nyquil without relief.

## 2018-03-29 NOTE — Discharge Instructions (Signed)
Take medication as prescribed. Rest. Drink plenty of fluids. Over the counter as discussed.  ° °Follow up with your primary care physician this week as needed. Return to Urgent care for new or worsening concerns.  ° °

## 2018-04-01 LAB — CULTURE, GROUP A STREP (THRC)

## 2018-04-23 ENCOUNTER — Encounter: Payer: Self-pay | Admitting: Emergency Medicine

## 2018-04-23 ENCOUNTER — Emergency Department: Payer: BLUE CROSS/BLUE SHIELD

## 2018-04-23 ENCOUNTER — Emergency Department
Admission: EM | Admit: 2018-04-23 | Discharge: 2018-04-23 | Disposition: A | Discharge status: Home / Self Care | Payer: BLUE CROSS/BLUE SHIELD | Attending: Student in an Organized Health Care Education/Training Program | Admitting: Student in an Organized Health Care Education/Training Program

## 2018-04-23 ENCOUNTER — Other Ambulatory Visit: Payer: Self-pay

## 2018-04-23 DIAGNOSIS — N201 Calculus of ureter: Secondary | ICD-10-CM | POA: Diagnosis not present

## 2018-04-23 DIAGNOSIS — Z87891 Personal history of nicotine dependence: Secondary | ICD-10-CM | POA: Diagnosis not present

## 2018-04-23 DIAGNOSIS — Z79899 Other long term (current) drug therapy: Secondary | ICD-10-CM | POA: Diagnosis not present

## 2018-04-23 DIAGNOSIS — R109 Unspecified abdominal pain: Secondary | ICD-10-CM | POA: Diagnosis present

## 2018-04-23 LAB — URINALYSIS, COMPLETE (UACMP) WITH MICROSCOPIC
BACTERIA UA: NONE SEEN
BILIRUBIN URINE: NEGATIVE
Glucose, UA: NEGATIVE mg/dL
KETONES UR: NEGATIVE mg/dL
Leukocytes, UA: NEGATIVE
Nitrite: NEGATIVE
PH: 7 (ref 5.0–8.0)
Protein, ur: NEGATIVE mg/dL
Specific Gravity, Urine: 1.013 (ref 1.005–1.030)
Squamous Epithelial / LPF: NONE SEEN (ref 0–5)

## 2018-04-23 LAB — BASIC METABOLIC PANEL
Anion gap: 7 (ref 5–15)
BUN: 12 mg/dL (ref 6–20)
CALCIUM: 9.2 mg/dL (ref 8.9–10.3)
CHLORIDE: 107 mmol/L (ref 98–111)
CO2: 25 mmol/L (ref 22–32)
CREATININE: 1.04 mg/dL (ref 0.61–1.24)
GFR calc non Af Amer: >60 mL/min (ref >60)
Glucose, Bld: 112 mg/dL — ABNORMAL HIGH (ref 70–99)
Potassium: 4 mmol/L (ref 3.5–5.1)
Sodium: 139 mmol/L (ref 135–145)

## 2018-04-23 LAB — CBC
HEMATOCRIT: 40.7 % (ref 39.0–52.0)
Hemoglobin: 13.1 g/dL (ref 13.0–17.0)
MCH: 26.6 pg (ref 26.0–34.0)
MCHC: 32.2 g/dL (ref 30.0–36.0)
MCV: 82.7 fL (ref 80.0–100.0)
NRBC: 0 % (ref 0.0–0.2)
PLATELETS: 240 10*3/uL (ref 150–400)
RBC: 4.92 MIL/uL (ref 4.22–5.81)
RDW: 13.9 % (ref 11.5–15.5)
WBC: 7.1 10*3/uL (ref 4.0–10.5)

## 2018-04-23 MED ORDER — HYDROCODONE-ACETAMINOPHEN 5-325 MG PO TABS: 1.0000 | ORAL_TABLET | ORAL | 0 refills | Status: AC | PRN

## 2018-04-23 MED ORDER — KETOROLAC TROMETHAMINE 30 MG/ML IJ SOLN
15.0000 mg | Freq: Once | INTRAMUSCULAR | Status: AC
  Administered 2018-04-23: 15 mg via INTRAVENOUS
  Filled 2018-04-23: qty 1

## 2018-04-23 MED ORDER — MORPHINE SULFATE (PF) 4 MG/ML IV SOLN
4.0000 mg | INTRAVENOUS | Status: DC | PRN
  Administered 2018-04-23: 4 mg via INTRAVENOUS
  Filled 2018-04-23: qty 1

## 2018-04-23 MED ORDER — SODIUM CHLORIDE 0.9 % IV BOLUS
1000.0000 mL | Freq: Once | INTRAVENOUS | Status: AC
  Administered 2018-04-23: 1000 mL via INTRAVENOUS

## 2018-04-23 MED ORDER — PROMETHAZINE HCL 25 MG/ML IJ SOLN
12.5000 mg | Freq: Four times a day (QID) | INTRAMUSCULAR | Status: DC | PRN
Start: 2018-04-23 — End: 2018-04-23

## 2018-04-23 MED ORDER — PROMETHAZINE HCL 12.5 MG PO TABS
12.5000 mg | ORAL_TABLET | Freq: Four times a day (QID) | ORAL | 0 refills | Status: AC | PRN
Start: 2018-04-23 — End: ?

## 2018-04-23 MED ORDER — HYDROCODONE-ACETAMINOPHEN 5-325 MG PO TABS
1.0000 | ORAL_TABLET | Freq: Once | ORAL | Status: AC
  Administered 2018-04-23: 1 via ORAL
  Filled 2018-04-23: qty 1

## 2018-04-23 MED ORDER — TAMSULOSIN HCL 0.4 MG PO CAPS: 0.4000 mg | ORAL_CAPSULE | Freq: Every day | ORAL | 0 refills | Status: AC

## 2018-04-23 NOTE — ED Triage Notes (Addendum)
Pt c/o sudden onset RT flank pain today, hx of kidney stones. Denies fever.

## 2018-04-23 NOTE — ED Provider Notes (Signed)
[facility] Emergency Department Provider Note    None    (approximate)  I have reviewed the triage vital signs and the nursing notes.   HISTORY  Chief Complaint Flank Pain    HPI Terry Shaffer is a 37 y.o. male history of kidney stones presents with sudden onset right flank pain that occurred while at work today.  Denies any fevers.  States that the pain is colicky in nature radiating down his right groin.  States it feels consistent with previous kidney stones.  Denies any trauma.  Denies any abdominal pain.  No dysuria but does note a red tinge to his urine.    Past Medical History:  Diagnosis Date  . Kidney stones    Family History  Problem Relation Age of Onset  . Cancer - Colon Mother   . Breast cancer Mother   . Hypertension Father    Past Surgical History:  Procedure Laterality Date  . HERNIA REPAIR    . PARATHYROIDECTOMY    . VASECTOMY Bilateral 10/12/14   There are no active problems to display for this patient.     Prior to Admission medications   Medication Sig Start Date End Date Taking? Authorizing Provider  HYDROcodone-acetaminophen (NORCO) 5-325 MG tablet Take 1 tablet by mouth every 4 (four) hours as needed for moderate pain. 04/23/18   Willy Eddy, MD  Multiple Vitamin (MULTIVITAMIN) tablet Take 1 tablet by mouth daily.    [provider]  oseltamivir (TAMIFLU) 75 MG capsule Take 1 capsule (75 mg total) by mouth every 12 (twelve) hours. 03/29/18   Renford Dills, NP  promethazine (PHENERGAN) 12.5 MG tablet Take 1 tablet (12.5 mg total) by mouth every 6 (six) hours as needed for nausea or vomiting. 04/23/18   Willy Eddy, MD  tamsulosin (FLOMAX) 0.4 MG CAPS capsule Take 1 capsule (0.4 mg total) by mouth daily after supper. 04/23/18   Willy Eddy, MD    Allergies Diphenhydramine and Doxycycline    Social History Social History   Tobacco Use  . Smoking status: Former Smoker    Packs/day:  0.25    Types: E-cigarettes, Cigarettes    Last attempt to quit: 10/29/2016    Years since quitting: 1.4  . Smokeless tobacco: Never Used  . Tobacco comment: Vape  Substance Use Topics  . Alcohol use: No    Alcohol/week: 0.0 standard drinks  . Drug use: No    Review of Systems Patient denies headaches, rhinorrhea, blurry vision, numbness, shortness of breath, chest pain, edema, cough, abdominal pain, nausea, vomiting, diarrhea, dysuria, fevers, rashes or hallucinations unless otherwise stated above in HPI. ____________________________________________   PHYSICAL EXAM:  VITAL SIGNS: Vitals:   04/23/18 1515 04/23/18 1830  BP: (!) 143/86 123/87  Pulse: 81 64  Resp: 16 18  Temp: 98.1 F (36.7 C)   SpO2: 98% 99%    Constitutional: Alert and oriented.  Eyes: Conjunctivae are normal.  Head: Atraumatic. Nose: No congestion/rhinnorhea. Mouth/Throat: Mucous membranes are moist.   Neck: No stridor. Painless ROM.  Cardiovascular: Normal rate, regular rhythm. Grossly normal heart sounds.  Good peripheral circulation. Respiratory: Normal respiratory effort.  No retractions. Lungs CTAB. Gastrointestinal: Soft and nontender. No distention. No abdominal bruits. + right CVA tenderness. Genitourinary:  Musculoskeletal: No lower extremity tenderness nor edema.  No joint effusions. Neurologic:  Normal speech and language. No gross focal neurologic deficits are appreciated. No facial droop Skin:  Skin is warm, dry and intact. No rash noted. Psychiatric: Mood and  affect are normal. Speech and behavior are normal.  ____________________________________________   LABS (all labs ordered are listed, but only abnormal results are displayed)  Results for orders placed or performed during the hospital encounter of 04/23/18 (from the past 24 hour(s))  Urinalysis, Complete w Microscopic     Status: Abnormal   Collection Time: 04/23/18  3:17 PM  Result Value Ref Range   Color, Urine STRAW (A)  YELLOW   APPearance CLEAR (A) CLEAR   Specific Gravity, Urine 1.013 1.005 - 1.030   pH 7.0 5.0 - 8.0   Glucose, UA NEGATIVE NEGATIVE mg/dL   Hgb urine dipstick LARGE (A) NEGATIVE   Bilirubin Urine NEGATIVE NEGATIVE   Ketones, ur NEGATIVE NEGATIVE mg/dL   Protein, ur NEGATIVE NEGATIVE mg/dL   Nitrite NEGATIVE NEGATIVE   Leukocytes, UA NEGATIVE NEGATIVE   RBC / HPF >50 (H) 0 - 5 RBC/hpf   WBC, UA 0-5 0 - 5 WBC/hpf   Bacteria, UA NONE SEEN NONE SEEN   Squamous Epithelial / LPF NONE SEEN 0 - 5   Mucus PRESENT   Basic metabolic panel     Status: Abnormal   Collection Time: 04/23/18  3:17 PM  Result Value Ref Range   Sodium 139 135 - 145 mmol/L   Potassium 4.0 3.5 - 5.1 mmol/L   Chloride 107 98 - 111 mmol/L   CO2 25 22 - 32 mmol/L   Glucose, Bld 112 (H) 70 - 99 mg/dL   BUN 12 6 - 20 mg/dL   Creatinine, Ser 9.141.04 0.61 - 1.24 mg/dL   Calcium 9.2 8.9 - 78.210.3 mg/dL   GFR calc non Af Amer >60 >60 mL/min   GFR calc Af Amer >60 >60 mL/min   Anion gap 7 5 - 15  CBC     Status: None   Collection Time: 04/23/18  3:17 PM  Result Value Ref Range   WBC 7.1 4.0 - 10.5 K/uL   RBC 4.92 4.22 - 5.81 MIL/uL   Hemoglobin 13.1 13.0 - 17.0 g/dL   HCT 95.640.7 21.339.0 - 08.652.0 %   MCV 82.7 80.0 - 100.0 fL   MCH 26.6 26.0 - 34.0 pg   MCHC 32.2 30.0 - 36.0 g/dL   RDW 57.813.9 46.911.5 - 62.915.5 %   Platelets 240 150 - 400 K/uL   nRBC 0.0 0.0 - 0.2 %   ____________________________________________ ____________________________________________  RADIOLOGY  I personally reviewed all radiographic images ordered to evaluate for the above acute complaints and reviewed radiology reports and findings.  These findings were personally discussed with the patient.  Please see medical record for radiology report.  ____________________________________________   PROCEDURES  Procedure(s) performed:  Procedures    Critical Care performed: no ____________________________________________   INITIAL IMPRESSION / ASSESSMENT AND  PLAN / ED COURSE  Pertinent labs & imaging results that were available during my care of the patient were reviewed by me and considered in my medical decision making (see chart for details).   DDX: stone, pyelo, nephritis, colitis, msk strain  Terry Shaffer is a 37 y.o. who presents to the ED with p/w acute right flank pain. No fevers, no systemic symptoms. no urinary symptoms. Denies trauma or injury. Afebrile in ED. Exam as above. Flank TTP, otherwise abdominal exam is benign. No peritoneal signs. Possible kidney stone, cystitis, or pyelonephritis.Marland Kitchen. UA with hematuria but no evidence of infection US renal with bilateral non-obstructing stones.  Clinical picture is not consistent with appendicitis, diverticulitis, pancreatitis, cholecystitis, bowel perforation, aortic dissection, splenic  injury or acute abdominal process at this time.  ----------------------------------------- 6:57 PM on 04/23/2018 -----------------------------------------   Pain improved, tolerating PO. Repeat ABD exam benign, will plan supportive treatment and early follow up for recheck.       As part of my medical decision making, I reviewed the following data within the electronic MEDICAL RECORD NUMBER Nursing notes reviewed and incorporated, Labs reviewed, notes from prior ED visits.   ____________________________________________   FINAL CLINICAL IMPRESSION(S) / ED DIAGNOSES  Final diagnoses:  Ureterolithiasis  Acute right flank pain      NEW MEDICATIONS STARTED DURING THIS VISIT:  New Prescriptions   HYDROCODONE-ACETAMINOPHEN (NORCO) 5-325 MG TABLET    Take 1 tablet by mouth every 4 (four) hours as needed for moderate pain.   PROMETHAZINE (PHENERGAN) 12.5 MG TABLET    Take 1 tablet (12.5 mg total) by mouth every 6 (six) hours as needed for nausea or vomiting.   TAMSULOSIN (FLOMAX) 0.4 MG CAPS CAPSULE    Take 1 capsule (0.4 mg total) by mouth daily after supper.     Note:  This document was prepared  using Dragon voice recognition software and may include unintentional dictation errors.    Willy Eddy, MD 04/23/18 [phone]

## 2018-04-23 NOTE — ED Notes (Signed)
Patient transported to CT 

## 2018-06-29 ENCOUNTER — Ambulatory Visit
Admission: EM | Admit: 2018-06-29 | Discharge: 2018-06-29 | Disposition: A | Discharge status: Home / Self Care | Payer: BLUE CROSS/BLUE SHIELD | Attending: Family Medicine | Admitting: Family Medicine

## 2018-06-29 ENCOUNTER — Other Ambulatory Visit: Payer: Self-pay

## 2018-06-29 DIAGNOSIS — R05 Cough: Secondary | ICD-10-CM | POA: Diagnosis not present

## 2018-06-29 DIAGNOSIS — J111 Influenza due to unidentified influenza virus with other respiratory manifestations: Secondary | ICD-10-CM

## 2018-06-29 DIAGNOSIS — R69 Illness, unspecified: Secondary | ICD-10-CM | POA: Diagnosis not present

## 2018-06-29 MED ORDER — OSELTAMIVIR PHOSPHATE 75 MG PO CAPS: 75.0000 mg | ORAL_CAPSULE | Freq: Two times a day (BID) | ORAL | 0 refills | Status: AC

## 2018-06-29 MED ORDER — HYDROCOD POLST-CPM POLST ER 10-8 MG/5ML PO SUER: 5.0000 mL | Freq: Two times a day (BID) | ORAL | 0 refills | Status: AC | PRN

## 2018-06-29 NOTE — ED Provider Notes (Signed)
[facility]    CSN: [id number] Arrival date & time: 06/29/18  1754     History   Chief Complaint Chief Complaint  Patient presents with  . Cough    HPI Terry Shaffer is a 38 y.o. male.   The history is provided by the patient.  Cough  Associated symptoms: fever, myalgias and rhinorrhea   Associated symptoms: no wheezing   URI  Presenting symptoms: cough, fatigue, fever and rhinorrhea   Severity:  Moderate Onset quality:  Sudden Duration:  3 days Timing:  Constant Progression:  Unchanged Chronicity:  New Relieved by:  None tried Ineffective treatments:  None tried Associated symptoms: myalgias   Associated symptoms: no wheezing   Risk factors: sick contacts     Past Medical History:  Diagnosis Date  . Kidney stones     There are no active problems to display for this patient.   Past Surgical History:  Procedure Laterality Date  . HERNIA REPAIR    . PARATHYROIDECTOMY    . VASECTOMY Bilateral 10/12/14       Home Medications    Prior to Admission medications   Medication Sig Start Date End Date Taking? Authorizing Provider  chlorpheniramine-HYDROcodone (TUSSIONEX PENNKINETIC ER) 10-8 MG/5ML SUER Take 5 mLs by mouth every 12 (twelve) hours as needed. 06/29/18   Payton Mccallum, MD  HYDROcodone-acetaminophen (NORCO) 5-325 MG tablet Take 1 tablet by mouth every 4 (four) hours as needed for moderate pain. 04/23/18   Willy Eddy, MD  Multiple Vitamin (MULTIVITAMIN) tablet Take 1 tablet by mouth daily.    [provider]  oseltamivir (TAMIFLU) 75 MG capsule Take 1 capsule (75 mg total) by mouth 2 (two) times daily. 06/29/18   Payton Mccallum, MD  promethazine (PHENERGAN) 12.5 MG tablet Take 1 tablet (12.5 mg total) by mouth every 6 (six) hours as needed for nausea or vomiting. 04/23/18   Willy Eddy, MD  tamsulosin (FLOMAX) 0.4 MG CAPS capsule Take 1 capsule (0.4 mg total) by mouth daily after supper. 04/23/18   Willy Eddy,  MD    Family History Family History  Problem Relation Age of Onset  . Cancer - Colon Mother   . Breast cancer Mother   . Hypertension Father     Social History Social History   Tobacco Use  . Smoking status: Former Smoker    Packs/day: 0.25    Types: E-cigarettes, Cigarettes    Last attempt to quit: 10/29/2016    Years since quitting: 1.6  . Smokeless tobacco: Never Used  . Tobacco comment: Vape  Substance Use Topics  . Alcohol use: No    Alcohol/week: 0.0 standard drinks  . Drug use: No     Allergies   Diphenhydramine and Doxycycline   Review of Systems Review of Systems  Constitutional: Positive for fatigue and fever.  HENT: Positive for rhinorrhea.   Respiratory: Positive for cough. Negative for wheezing.   Musculoskeletal: Positive for myalgias.     Physical Exam Triage Vital Signs ED Triage Vitals  Enc Vitals Group     BP 06/29/18 1819 (!) 135/93     Pulse Rate 06/29/18 1819 69     Resp 06/29/18 1819 18     Temp 06/29/18 1819 98.4 F (36.9 C)     Temp Source 06/29/18 1819 Oral     SpO2 06/29/18 1819 100 %     Weight 06/29/18 1817 220 lb (99.8 kg)     Height 06/29/18 1817 5\' 11"  (1.803 m)  Head Circumference --      Peak Flow --      Pain Score 06/29/18 1817 8     Pain Loc --      Pain Edu? --      Excl. in GC? --    No data found.  Updated Vital Signs BP (!) 135/93 (BP Location: Left Arm)   Pulse 69   Temp 98.4 F (36.9 C) (Oral)   Resp 18   Ht 5\' 11"  (1.803 m)   Wt 99.8 kg   SpO2 100%   BMI 30.68 kg/m   Visual Acuity Right Eye Distance:   Left Eye Distance:   Bilateral Distance:    Right Eye Near:   Left Eye Near:    Bilateral Near:     Physical Exam Vitals signs and nursing note reviewed.  Constitutional:      General: He is not in acute distress.    Appearance: He is well-developed. He is not toxic-appearing or diaphoretic.  HENT:     Head: Normocephalic and atraumatic.     Right Ear: Tympanic membrane, ear canal  and external ear normal.     Left Ear: Tympanic membrane, ear canal and external ear normal.     Nose: Rhinorrhea present.     Mouth/Throat:     Pharynx: Uvula midline. No oropharyngeal exudate.     Tonsils: No tonsillar abscesses.  Eyes:     General: No scleral icterus.       Right eye: No discharge.        Left eye: No discharge.  Neck:     Musculoskeletal: Normal range of motion and neck supple.     Thyroid: No thyromegaly.     Trachea: No tracheal deviation.  Cardiovascular:     Rate and Rhythm: Normal rate and regular rhythm.     Heart sounds: Normal heart sounds.  Pulmonary:     Effort: Pulmonary effort is normal. No respiratory distress.     Breath sounds: Normal breath sounds. No stridor. No wheezing, rhonchi or rales.  Chest:     Chest wall: No tenderness.  Lymphadenopathy:     Cervical: No cervical adenopathy.  Skin:    General: Skin is warm and dry.     Findings: No rash.  Neurological:     Mental Status: He is alert.      UC Treatments / Results  Labs (all labs ordered are listed, but only abnormal results are displayed) Labs Reviewed - No data to display  EKG None  Radiology No results found.  Procedures Procedures (including critical care time)  Medications Ordered in UC Medications - No data to display  Initial Impression / Assessment and Plan / UC Course  I have reviewed the triage vital signs and the nursing notes.  Pertinent labs & imaging results that were available during my care of the patient were reviewed by me and considered in my medical decision making (see chart for details).      Final Clinical Impressions(s) / UC Diagnoses   Final diagnoses:  Influenza-like illness    ED Prescriptions    Medication Sig Dispense Auth. Provider   oseltamivir (TAMIFLU) 75 MG capsule Take 1 capsule (75 mg total) by mouth 2 (two) times daily. 10 capsule Payton Mccallum, MD   chlorpheniramine-HYDROcodone (TUSSIONEX PENNKINETIC ER) 10-8 MG/5ML  SUER Take 5 mLs by mouth every 12 (twelve) hours as needed. 60 mL Payton Mccallum, MD     1. diagnosis reviewed with patient 2. rx  as per orders above; reviewed possible side effects, interactions, risks and benefits  3. Recommend supportive treatment with rest, fluids, otc analgesics prn 4. Follow-up prn if symptoms worsen or don't improve  Controlled Substance Prescriptions [place] Controlled Substance Registry consulted? Not Applicable   Payton Mccallumonty, Dianca Owensby, MD 06/29/18 [phone]

## 2018-06-29 NOTE — ED Triage Notes (Signed)
Patient complains of cough, sinus pressure, body aches, chills, and sweats x 3 days.

## 2018-07-09 ENCOUNTER — Other Ambulatory Visit: Payer: Self-pay

## 2018-07-09 ENCOUNTER — Emergency Department: Payer: BLUE CROSS/BLUE SHIELD

## 2018-07-09 ENCOUNTER — Emergency Department
Admission: EM | Admit: 2018-07-09 | Discharge: 2018-07-09 | Disposition: A | Discharge status: Home / Self Care | Payer: BLUE CROSS/BLUE SHIELD | Attending: Emergency Medicine | Admitting: Emergency Medicine

## 2018-07-09 ENCOUNTER — Encounter: Payer: Self-pay | Admitting: Emergency Medicine

## 2018-07-09 DIAGNOSIS — N50812 Left testicular pain: Secondary | ICD-10-CM | POA: Insufficient documentation

## 2018-07-09 DIAGNOSIS — N50819 Testicular pain, unspecified: Secondary | ICD-10-CM

## 2018-07-09 DIAGNOSIS — Z87891 Personal history of nicotine dependence: Secondary | ICD-10-CM | POA: Diagnosis not present

## 2018-07-09 LAB — URINALYSIS, COMPLETE (UACMP) WITH MICROSCOPIC
Bacteria, UA: NONE SEEN
Bilirubin Urine: NEGATIVE
Glucose, UA: NEGATIVE mg/dL
Ketones, ur: NEGATIVE mg/dL
Leukocytes,Ua: NEGATIVE
Nitrite: NEGATIVE
Protein, ur: NEGATIVE mg/dL
Specific Gravity, Urine: 1.006 (ref 1.005–1.030)
Squamous Epithelial / HPF: NONE SEEN (ref 0–5)
pH: 6 (ref 5.0–8.0)

## 2018-07-09 MED ORDER — TRAMADOL HCL 50 MG PO TABS: 50.0000 mg | ORAL_TABLET | Freq: Four times a day (QID) | ORAL | 0 refills | Status: AC | PRN

## 2018-07-09 MED ORDER — OXYCODONE-ACETAMINOPHEN 5-325 MG PO TABS
1.0000 | ORAL_TABLET | Freq: Once | ORAL | Status: AC
  Administered 2018-07-09: 1 via ORAL
  Filled 2018-07-09: qty 1

## 2018-07-09 MED ORDER — KETOROLAC TROMETHAMINE 30 MG/ML IJ SOLN
30.0000 mg | Freq: Once | INTRAMUSCULAR | Status: AC
  Administered 2018-07-09: 30 mg via INTRAMUSCULAR
  Filled 2018-07-09: qty 1

## 2018-07-09 NOTE — ED Triage Notes (Signed)
C/O left testicular pain, onset of symptoms today at 1400.  Denies dysuria.

## 2018-07-09 NOTE — ED Provider Notes (Signed)
[facility] Emergency Department Provider Note   ____________________________________________    I have reviewed the triage vital signs and the nursing notes.   HISTORY  Chief Complaint Testicle Pain     HPI Terry Shaffer is a 38 y.o. male who presents with complaints of testicle pain.  Patient reports pain started earlier today, he describes it as a somewhat sharp but also aching pain in the left part of his left testicle.  Denies injury to the area.  No dysuria.  No penile discharge.  No concern regarding STDs.  No rash.  No fevers or chills or abdominal pain.  No flank pain.  Has not take anything for this  Past Medical History:  Diagnosis Date  . Kidney stones     There are no active problems to display for this patient.   Past Surgical History:  Procedure Laterality Date  . HERNIA REPAIR    . PARATHYROIDECTOMY    . VASECTOMY Bilateral 10/12/14    Prior to Admission medications   Medication Sig Start Date End Date Taking? Authorizing Provider  chlorpheniramine-HYDROcodone (TUSSIONEX PENNKINETIC ER) 10-8 MG/5ML SUER Take 5 mLs by mouth every 12 (twelve) hours as needed. 06/29/18   Payton Mccallum, MD  HYDROcodone-acetaminophen (NORCO) 5-325 MG tablet Take 1 tablet by mouth every 4 (four) hours as needed for moderate pain. 04/23/18   Willy Eddy, MD  Multiple Vitamin (MULTIVITAMIN) tablet Take 1 tablet by mouth daily.    [provider]  oseltamivir (TAMIFLU) 75 MG capsule Take 1 capsule (75 mg total) by mouth 2 (two) times daily. 06/29/18   Payton Mccallum, MD  promethazine (PHENERGAN) 12.5 MG tablet Take 1 tablet (12.5 mg total) by mouth every 6 (six) hours as needed for nausea or vomiting. 04/23/18   Willy Eddy, MD  tamsulosin (FLOMAX) 0.4 MG CAPS capsule Take 1 capsule (0.4 mg total) by mouth daily after supper. 04/23/18   Willy Eddy, MD     Allergies Diphenhydramine and Doxycycline  Family History  Problem  Relation Age of Onset  . Cancer - Colon Mother   . Breast cancer Mother   . Hypertension Father     Social History Social History   Tobacco Use  . Smoking status: Former Smoker    Packs/day: 0.25    Types: E-cigarettes, Cigarettes    Last attempt to quit: 10/29/2016    Years since quitting: 1.6  . Smokeless tobacco: Never Used  . Tobacco comment: Vape  Substance Use Topics  . Alcohol use: No    Alcohol/week: 0.0 standard drinks  . Drug use: No    Review of Systems  Constitutional: No fever/chills Eyes: No visual changes.  ENT: No sore throat. Cardiovascular: Denies chest pain. Respiratory: Denies shortness of breath. Gastrointestinal: As above Genitourinary: As above Musculoskeletal: Negative for back pain. Skin: Negative for rash. Neurological: Negative for headaches    ____________________________________________   PHYSICAL EXAM:  VITAL SIGNS: ED Triage Vitals  Enc Vitals Group     BP 07/09/18 1743 132/90     Pulse Rate 07/09/18 1743 81     Resp --      Temp 07/09/18 1743 97.8 F (36.6 C)     Temp Source 07/09/18 1743 Oral     SpO2 07/09/18 1743 100 %     Weight 07/09/18 1733 99.8 kg (220 lb)     Height 07/09/18 1733 1.803 m (5\' 11" )     Head Circumference --      Peak Flow --  Pain Score 07/09/18 1733 10     Pain Loc --      Pain Edu? --      Excl. in GC? --     Constitutional: Alert and oriented.     Mouth/Throat: Mucous membranes are moist.    Cardiovascular: Normal rate, regular rhythm. Peri [place] peripheral circulation. Respiratory: Normal respiratory effort.  No retractions. Gastrointestinal: Soft and nontender. No distention.  No CVA tenderness. Genitourinary: Mild tenderness palpation along the left superior lateral left testicle, no scrotal swelling, no abscesses, no rash or erythema, no penile discharge Musculoskeletal:  Warm and well perfused Neurologic:  Normal speech and language. No gross focal neurologic deficits are  appreciated.  Skin:  Skin is warm, dry and intact. No rash noted. Psychiatric: Mood and affect are normal. Speech and behavior are normal.  ____________________________________________   LABS (all labs ordered are listed, but only abnormal results are displayed)  Labs Reviewed  URINALYSIS, COMPLETE (UACMP) WITH MICROSCOPIC   ____________________________________________  EKG  None ____________________________________________  RADIOLOGY  Ultrasound demonstrates 1.2 cm soft tissue nodule left epididymal tail, this was seen in 2018, discussed with patient the need for outpatient urology follow-up regarding this, this does appear to be where his pain is ____________________________________________   PROCEDURES  Procedure(s) performed: No  Procedures   Critical Care performed:No ____________________________________________   INITIAL IMPRESSION / ASSESSMENT AND PLAN / ED COURSE  Pertinent labs & imaging results that were available during my care of the patient were reviewed by me and considered in my medical decision making (see chart for details).  Patient with left testicular pain as above, ultrasound demonstrates nodule which I discussed with the patient, this may be causing some mild epididymitis, pending urinalysis, treated with IM Toradol  Patient improved after Toradol, will refer to Dr. Irish Elders of urology    ____________________________________________   FINAL CLINICAL IMPRESSION(S) / ED DIAGNOSES  Final diagnoses:  Testicle pain        Note:  This document was prepared using Dragon voice recognition software and may include unintentional dictation errors.   Jene Every, MD 07/09/18 2114

## 2018-07-09 NOTE — ED Notes (Signed)
Pt presents to ED via POV with c/o "I feel like I've been kicked in the nuts a million times". Pt states pain more on the left side. Pt denies difficulty urinating, pt states he feels like he can feel a knot to the L side.

## 2022-03-21 ENCOUNTER — Ambulatory Visit: Payer: Self-pay

## 2022-03-27 ENCOUNTER — Other Ambulatory Visit: Payer: Self-pay | Admitting: General Surgery

## 2022-03-27 NOTE — Progress Notes (Signed)
Progress Notes - documented in this encounter Barbara Keng, Albina Billet, MD - 03/27/2022 3:00 PM EST Formatting of this note is different from the original. Subjective:  Patient ID: Terry Shaffer is a 41 y.o. male.  HPI  The following portions of the patient's history were reviewed and updated as appropriate.  This a new patient is here today for: office visit. Here to discuss having a colonoscopy, none prior. His mother has a history of colon cancer.  He states his bowels move every other day, no bleeding.  Review of Systems Constitutional: Negative for chills and fever. Respiratory: Negative for cough.  Chief Complaint Patient presents with Pre-op Exam   BP 126/70  Pulse 87  Temp 36.7 C (98 F)  Ht 177.8 cm (5\' 10" )  Wt (!) 105.7 kg (233 lb)  SpO2 96%  BMI 33.43 kg/m  Past Medical History: Diagnosis Date Kidney stones   Past Surgical History: Procedure Laterality Date VASECTOMY 10/12/2014 hernia surgery PARATHYROIDECTOMY   Social History  Socioeconomic History Marital status: Single Tobacco Use Smoking status: Former Packs/day: 1.00 Years: 2.00 Additional pack years: 0.00 Total pack years: 2.00 Types: Cigarettes Quit date: 07/27/2013 Years since quitting: 8.6 Smokeless tobacco: Never Substance and Sexual Activity Alcohol use: Never Drug use: Never   Allergies Allergen Reactions Benadryl [Diphenhydramine Hcl] Swelling Diphenhydramine Swelling Doxycycline Rash and Other (See Comments) Other reaction(s): Other (See Comments) Reaction: Jittery    No current outpatient medications on file.  No current facility-administered medications for this visit.  Family History Problem Relation Age of Onset Colon cancer Mother 46 Breast cancer Mother 52 High blood pressure (Hypertension) Father Colon cancer Maternal Grandmother  Labs and Radiology:  None to review.   Objective: Physical Exam Constitutional: Appearance: Normal  appearance. Cardiovascular: Rate and Rhythm: Normal rate and regular rhythm. Pulses: Normal pulses. Heart sounds: Normal heart sounds. Pulmonary: Effort: Pulmonary effort is normal. Breath sounds: Normal breath sounds. Musculoskeletal: Cervical back: Neck supple. Skin: General: Skin is warm and dry. Neurological: Mental Status: He is alert and oriented to person, place, and time. Psychiatric: Mood and Affect: Mood normal. Behavior: Behavior normal.   Assessment:  Candidate for colon cancer screening with colonoscopy based on family history.  Plan:  Indications for the procedure were reviewed. Risks and benefits discussed.  He was instructed by the staff in regards to preparation.   This note is partially prepared by 58, RN, acting as a scribe in the presence of Dr. Dorathy Daft, MD. The documentation recorded by the scribe accurately reflects the service I personally performed and the decisions made by me.  Donnalee Curry, MD FACS  Electronically signed by Earline Mayotte, MD at 03/27/2022 3:58 PM ES

## 2022-04-08 ENCOUNTER — Encounter: Payer: Self-pay | Admitting: General Surgery

## 2022-04-09 ENCOUNTER — Encounter
Admission: RE | Disposition: A | Discharge status: Home / Self Care | Payer: Self-pay | Source: Home / Self Care | Attending: General Surgery

## 2022-04-09 ENCOUNTER — Encounter: Payer: Self-pay | Admitting: General Surgery

## 2022-04-09 ENCOUNTER — Other Ambulatory Visit: Payer: Self-pay

## 2022-04-09 ENCOUNTER — Ambulatory Visit
Admission: RE | Admit: 2022-04-09 | Discharge: 2022-04-09 | Disposition: A | Discharge status: Home / Self Care | Payer: BC Managed Care – PPO | Attending: General Surgery | Admitting: General Surgery

## 2022-04-09 ENCOUNTER — Ambulatory Visit: Payer: BC Managed Care – PPO | Admitting: Anesthesiology

## 2022-04-09 DIAGNOSIS — Z800 Family history of malignant neoplasm of digestive organs: Secondary | ICD-10-CM | POA: Diagnosis not present

## 2022-04-09 DIAGNOSIS — Z1211 Encounter for screening for malignant neoplasm of colon: Secondary | ICD-10-CM | POA: Insufficient documentation

## 2022-04-09 DIAGNOSIS — Z87891 Personal history of nicotine dependence: Secondary | ICD-10-CM | POA: Diagnosis not present

## 2022-04-09 HISTORY — PX: COLONOSCOPY WITH PROPOFOL: SHX5780

## 2022-04-09 HISTORY — DX: Personal history of urinary calculi: Z87.442

## 2022-04-09 SURGERY — COLONOSCOPY WITH PROPOFOL
Anesthesia: General

## 2022-04-09 MED ORDER — PROPOFOL 10 MG/ML IV BOLUS
INTRAVENOUS | Status: AC
  Filled 2022-04-09: qty 20

## 2022-04-09 MED ORDER — PROPOFOL 500 MG/50ML IV EMUL
INTRAVENOUS | Status: DC | PRN
  Administered 2022-04-09: 150 ug/kg/min via INTRAVENOUS

## 2022-04-09 MED ORDER — MIDAZOLAM HCL 2 MG/2ML IJ SOLN
INTRAMUSCULAR | Status: DC | PRN
  Administered 2022-04-09: 2 mg via INTRAVENOUS

## 2022-04-09 MED ORDER — SODIUM CHLORIDE 0.9 % IV SOLN: INTRAVENOUS | Status: DC

## 2022-04-09 MED ORDER — MIDAZOLAM HCL 2 MG/2ML IJ SOLN
INTRAMUSCULAR | Status: AC
  Filled 2022-04-09: qty 2

## 2022-04-09 MED ORDER — LIDOCAINE HCL (CARDIAC) PF 100 MG/5ML IV SOSY
PREFILLED_SYRINGE | INTRAVENOUS | Status: DC | PRN
  Administered 2022-04-09: 50 mg via INTRAVENOUS

## 2022-04-09 MED ORDER — PROPOFOL 10 MG/ML IV BOLUS
INTRAVENOUS | Status: DC | PRN
  Administered 2022-04-09: 70 mg via INTRAVENOUS

## 2022-04-09 NOTE — Anesthesia Preprocedure Evaluation (Signed)
Anesthesia Evaluation  Patient identified by MRN, date of birth, ID band Patient awake    Airway Mallampati: II  TM Distance: >3 FB Neck ROM: Full    Dental no notable dental hx.    Pulmonary former smoker   Pulmonary exam normal - rhonchi (-) wheezing      Cardiovascular negative cardio ROS Normal cardiovascular exam Rhythm:Regular     Neuro/Psych negative neurological ROS  negative psych ROS   GI/Hepatic negative GI ROS, Neg liver ROS,,,  Endo/Other  negative endocrine ROS    Renal/GU negative Renal ROS  negative genitourinary   Musculoskeletal   Abdominal Normal abdominal exam  (+)   Peds  Hematology negative hematology ROS (+)   Anesthesia Other Findings   Reproductive/Obstetrics                             Anesthesia Physical Anesthesia Plan  ASA: 2  Anesthesia Plan: General   Post-op Pain Management:    Induction: Intravenous  PONV Risk Score and Plan:   Airway Management Planned: Natural Airway  Additional Equipment:   Intra-op Plan:   Post-operative Plan:   Informed Consent: I have reviewed the patients History and Physical, chart, labs and discussed the procedure including the risks, benefits and alternatives for the proposed anesthesia with the patient or authorized representative who has indicated his/her understanding and acceptance.     Dental advisory given  Plan Discussed with:   Anesthesia Plan Comments: (IVGA, PIV x 1, ASA SM, RBO discussed, Consent signed.)       Anesthesia Quick Evaluation

## 2022-04-09 NOTE — Op Note (Signed)
[facility] Patient Name: Terry Shaffer Procedure Date: 04/09/2022 8:19 AM MRN: [id number] Account #: [id number] Date of Birth: 06-14-1980 Admit Type: Outpatient Age: 41 Room: [facility] ENDO ROOM 1 Gender: Male Note Status: Finalized Instrument Name: Peds Colonoscope [id number] Procedure:             Colonoscopy Indications:           Screening patient at increased risk: Family history of                         1st-degree relative with colorectal cancer at age 45                         years (or older) Providers:             Earline Mayotte, MD Referring MD:          No Local Md, MD (Referring MD) Medicines:             Propofol per Anesthesia Complications:         No immediate complications. Procedure:             Pre-Anesthesia Assessment:                        - Prior to the procedure, a History and Physical was                         performed, and patient medications, allergies and                         sensitivities were reviewed. The patient's tolerance                         of previous anesthesia was reviewed.                        - The risks and benefits of the procedure and the                         sedation options and risks were discussed with the                         patient. All questions were answered and informed                         consent was obtained.                        After obtaining informed consent, the colonoscope was                         passed under direct vision. Throughout the procedure,                         the patient's blood pressure, pulse, and oxygen                         saturations were monitored continuously. The  Colonoscope was introduced through the anus and                         advanced to the the cecum, identified by appendiceal                         orifice and ileocecal valve. The colonoscopy was                         somewhat difficult due to  significant looping and a                         tortuous colon. Successful completion of the procedure                         was aided by changing the patient to a prone position. Findings:      The entire examined colon appeared normal on direct and retroflexion       views. Impression:            - The entire examined colon is normal on direct and                         retroflexion views.                        - No specimens collected. Recommendation:        - Repeat colonoscopy in 5 years for surveillance. Procedure Code(s):     --- Professional ---                        [id number], Colonoscopy, flexible; diagnostic, including                         collection of specimen(s) by brushing or washing, when                         performed (separate procedure) Diagnosis Code(s):     --- Professional ---                        Z80.0, Family history of malignant neoplasm of                         digestive organs CPT copyright 2022 [employer]. All rights reserved. The codes documented in this report are preliminary and upon coder review may  be revised to meet current compliance requirements. Earline Mayotte, MD 04/09/2022 9:05:40 AM This report has been signed electronically. Number of Addenda: 0 Note Initiated On: 04/09/2022 8:19 AM Scope Withdrawal Time: 0 hours 9 minutes 52 seconds  Total Procedure Duration: 0 hours 38 minutes 8 seconds  Estimated Blood Loss:  Estimated blood loss: none.      [facility]

## 2022-04-09 NOTE — Anesthesia Postprocedure Evaluation (Signed)
Anesthesia Post Note  Patient: Terry Shaffer  Procedure(s) Performed: COLONOSCOPY WITH PROPOFOL  Patient location during evaluation: PACU Anesthesia Type: General Level of consciousness: awake and alert Pain management: pain level controlled Vital Signs Assessment: post-procedure vital signs reviewed and stable Respiratory status: spontaneous breathing, nonlabored ventilation, respiratory function stable and patient connected to nasal cannula oxygen Cardiovascular status: stable and blood pressure returned to baseline Postop Assessment: no apparent nausea or vomiting Anesthetic complications: no   No notable events documented.   Last Vitals:  Vitals:   04/09/22 0920 04/09/22 0930  BP: 108/79 120/82  Pulse: 79 81  Resp: 14 15  Temp:    SpO2: 96% 96%    Last Pain:  Vitals:   04/09/22 0930  TempSrc:   PainSc: 0-No pain                 Lannette Donath

## 2022-04-09 NOTE — Transfer of Care (Signed)
Immediate Anesthesia Transfer of Care Note  Patient: Terry Shaffer  Procedure(s) Performed: COLONOSCOPY WITH PROPOFOL  Patient Location: PACU and Endoscopy Unit  Anesthesia Type:General  Level of Consciousness: drowsy and patient cooperative  Airway & Oxygen Therapy: Patient Spontanous Breathing  Post-op Assessment: Report given to RN and Post -op Vital signs reviewed and stable  Post vital signs: Reviewed and stable  Last Vitals:  Vitals Value Taken Time  BP 115/95 04/09/22 0910  Temp    Pulse 88 04/09/22 0910  Resp 15 04/09/22 0910  SpO2 95 % 04/09/22 0910    Last Pain:  Vitals:   04/09/22 0910  TempSrc:   PainSc: Asleep         Complications: No notable events documented.

## 2022-04-09 NOTE — Anesthesia Procedure Notes (Signed)
Procedure Name: MAC Date/Time: 04/09/2022 8:24 AM  Performed by: Jerrye Noble, CRNAPre-anesthesia Checklist: Patient identified, Emergency Drugs available, Suction available and Patient being monitored Patient Re-evaluated:Patient Re-evaluated prior to induction Oxygen Delivery Method: Nasal cannula

## 2022-04-09 NOTE — H&P (Signed)
Terry Shaffer [id number] 01-06-1981     HPI:  Screening colonoscopy for family history of colon cancer before age 42 (mother).   Medications Prior to Admission  Medication Sig Dispense Refill Last Dose   chlorpheniramine-HYDROcodone (TUSSIONEX PENNKINETIC ER) 10-8 MG/5ML SUER Take 5 mLs by mouth every 12 (twelve) hours as needed. 60 mL 0    HYDROcodone-acetaminophen (NORCO) 5-325 MG tablet Take 1 tablet by mouth every 4 (four) hours as needed for moderate pain. 6 tablet 0    Multiple Vitamin (MULTIVITAMIN) tablet Take 1 tablet by mouth daily.      oseltamivir (TAMIFLU) 75 MG capsule Take 1 capsule (75 mg total) by mouth 2 (two) times daily. 10 capsule 0    promethazine (PHENERGAN) 12.5 MG tablet Take 1 tablet (12.5 mg total) by mouth every 6 (six) hours as needed for nausea or vomiting. 12 tablet 0    tamsulosin (FLOMAX) 0.4 MG CAPS capsule Take 1 capsule (0.4 mg total) by mouth daily after supper. 10 capsule 0    Allergies  Allergen Reactions   Diphenhydramine Swelling   Doxycycline Other (See Comments)    Reaction: Jittery   Past Medical History:  Diagnosis Date   History of kidney stones    Past Surgical History:  Procedure Laterality Date   HERNIA REPAIR     PARATHYROIDECTOMY     VASECTOMY Bilateral 10/12/14   Social History   Socioeconomic History   Marital status: Single    Spouse name: Not on file   Number of children: Not on file   Years of education: Not on file   Highest education level: Not on file  Occupational History   Not on file  Tobacco Use   Smoking status: Former    Packs/day: 0.25    Types: E-cigarettes, Cigarettes    Quit date: 10/29/2016    Years since quitting: 5.4   Smokeless tobacco: Never   Tobacco comments:    Vape  Vaping Use   Vaping Use: Former   Quit date: 10/29/2016  Substance and Sexual Activity   Alcohol use: No    Alcohol/week: 0.0 standard drinks of alcohol   Drug use: No   Sexual activity: Not Currently  Other Topics  Concern   Not on file  Social History Narrative   Not on file   Social Determinants of Health   Financial Resource Strain: Not on file  Food Insecurity: Not on file  Transportation Needs: Not on file  Physical Activity: Not on file  Stress: Not on file  Social Connections: Not on file  Intimate Partner Violence: Not on file   Social History   Social History Narrative   Not on file     ROS: Negative.     PE: HEENT: Negative. Lungs: Clear. Cardio: RR.   Assessment/Plan:  Proceed with planned endoscopy.  Merrily Pew [facility] 04/09/2022

## 2022-04-11 NOTE — Progress Notes (Signed)
Non-identified Voicemail.  No Message Left.
